# Patient Record
Sex: Male | Born: 1935
Health system: Southern US, Community
[De-identification: ages and names within clinical notes are randomized; demographics above are authoritative.]

## PROBLEM LIST (undated history)

## (undated) DIAGNOSIS — H3530 Unspecified macular degeneration: Secondary | ICD-10-CM

## (undated) DIAGNOSIS — D649 Anemia, unspecified: Secondary | ICD-10-CM

## (undated) DIAGNOSIS — F419 Anxiety disorder, unspecified: Secondary | ICD-10-CM

## (undated) DIAGNOSIS — K219 Gastro-esophageal reflux disease without esophagitis: Secondary | ICD-10-CM

## (undated) DIAGNOSIS — R413 Other amnesia: Secondary | ICD-10-CM

## (undated) DIAGNOSIS — K5900 Constipation, unspecified: Secondary | ICD-10-CM

## (undated) DIAGNOSIS — T8859XA Other complications of anesthesia, initial encounter: Secondary | ICD-10-CM

## (undated) DIAGNOSIS — M1990 Unspecified osteoarthritis, unspecified site: Secondary | ICD-10-CM

## (undated) DIAGNOSIS — C801 Malignant (primary) neoplasm, unspecified: Secondary | ICD-10-CM

## (undated) DIAGNOSIS — F32A Depression, unspecified: Secondary | ICD-10-CM

## (undated) DIAGNOSIS — F329 Major depressive disorder, single episode, unspecified: Secondary | ICD-10-CM

## (undated) DIAGNOSIS — T4145XA Adverse effect of unspecified anesthetic, initial encounter: Secondary | ICD-10-CM

## (undated) DIAGNOSIS — L409 Psoriasis, unspecified: Secondary | ICD-10-CM

## (undated) DIAGNOSIS — I10 Essential (primary) hypertension: Secondary | ICD-10-CM

## (undated) DIAGNOSIS — H9190 Unspecified hearing loss, unspecified ear: Secondary | ICD-10-CM

## (undated) HISTORY — PX: TONSILLECTOMY: SUR1361

## (undated) HISTORY — PX: CATARACT EXTRACTION W/ INTRAOCULAR LENS  IMPLANT, BILATERAL: SHX1307

## (undated) HISTORY — PX: COLONOSCOPY W/ BIOPSIES AND POLYPECTOMY: SHX1376

## (undated) HISTORY — PX: APPENDECTOMY: SHX54

---
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
## 2012-10-21 ENCOUNTER — Other Ambulatory Visit (HOSPITAL_COMMUNITY)
Admission: RE | Admit: 2012-10-21 | Discharge: 2012-10-21 | Disposition: A | Discharge status: Home / Self Care | Payer: Medicare Other | Source: Ambulatory Visit | Attending: Otolaryngology | Admitting: Otolaryngology

## 2012-10-21 DIAGNOSIS — R599 Enlarged lymph nodes, unspecified: Secondary | ICD-10-CM | POA: Insufficient documentation

## 2012-11-14 ENCOUNTER — Other Ambulatory Visit (HOSPITAL_COMMUNITY): Payer: Self-pay | Admitting: Otolaryngology

## 2012-11-14 ENCOUNTER — Encounter (HOSPITAL_COMMUNITY): Payer: Self-pay | Admitting: *Deleted

## 2012-11-14 NOTE — Progress Notes (Signed)
Pt denies SOB, chest pain, and being under the care of a cardiologist. Pt denies having an EKG and chest x ray within the last year. Pt also denies having a stress test, echo, and cardiac cath. Pt states that he has difficulty breathing and swallowing when laying flat on his back.

## 2012-11-18 ENCOUNTER — Inpatient Hospital Stay (HOSPITAL_COMMUNITY): Payer: Medicare Other | Admitting: Certified Registered"

## 2012-11-18 ENCOUNTER — Inpatient Hospital Stay (HOSPITAL_COMMUNITY): Payer: Medicare Other

## 2012-11-18 ENCOUNTER — Encounter (HOSPITAL_COMMUNITY)
Admission: RE | Disposition: A | Discharge status: Home / Self Care | Payer: Self-pay | Source: Ambulatory Visit | Attending: Otolaryngology

## 2012-11-18 ENCOUNTER — Inpatient Hospital Stay (HOSPITAL_COMMUNITY)
Admission: RE | Admit: 2012-11-18 | Discharge: 2012-11-20 | DRG: 828 | Disposition: A | Discharge status: Home / Self Care | Payer: Medicare Other | Source: Ambulatory Visit | Attending: Otolaryngology | Admitting: Otolaryngology

## 2012-11-18 ENCOUNTER — Encounter (HOSPITAL_COMMUNITY): Payer: Self-pay | Admitting: Certified Registered"

## 2012-11-18 ENCOUNTER — Encounter (HOSPITAL_COMMUNITY): Payer: Medicare Other | Admitting: Certified Registered"

## 2012-11-18 DIAGNOSIS — M129 Arthropathy, unspecified: Secondary | ICD-10-CM | POA: Diagnosis present

## 2012-11-18 DIAGNOSIS — C4442 Squamous cell carcinoma of skin of scalp and neck: Secondary | ICD-10-CM

## 2012-11-18 DIAGNOSIS — Z85828 Personal history of other malignant neoplasm of skin: Secondary | ICD-10-CM

## 2012-11-18 DIAGNOSIS — H3530 Unspecified macular degeneration: Secondary | ICD-10-CM | POA: Diagnosis present

## 2012-11-18 DIAGNOSIS — K219 Gastro-esophageal reflux disease without esophagitis: Secondary | ICD-10-CM | POA: Diagnosis present

## 2012-11-18 DIAGNOSIS — I10 Essential (primary) hypertension: Secondary | ICD-10-CM | POA: Diagnosis present

## 2012-11-18 DIAGNOSIS — C50919 Malignant neoplasm of unspecified site of unspecified female breast: Principal | ICD-10-CM | POA: Diagnosis present

## 2012-11-18 DIAGNOSIS — Z87891 Personal history of nicotine dependence: Secondary | ICD-10-CM

## 2012-11-18 DIAGNOSIS — H9190 Unspecified hearing loss, unspecified ear: Secondary | ICD-10-CM | POA: Diagnosis present

## 2012-11-18 HISTORY — PX: RADICAL NECK DISSECTION: SHX2284

## 2012-11-18 HISTORY — DX: Psoriasis, unspecified: L40.9

## 2012-11-18 HISTORY — PX: PAROTIDECTOMY: SHX2163

## 2012-11-18 HISTORY — DX: Unspecified hearing loss, unspecified ear: H91.90

## 2012-11-18 HISTORY — DX: Gastro-esophageal reflux disease without esophagitis: K21.9

## 2012-11-18 HISTORY — DX: Unspecified osteoarthritis, unspecified site: M19.90

## 2012-11-18 HISTORY — DX: Essential (primary) hypertension: I10

## 2012-11-18 HISTORY — DX: Unspecified macular degeneration: H35.30

## 2012-11-18 HISTORY — DX: Other complications of anesthesia, initial encounter: T88.59XA

## 2012-11-18 HISTORY — DX: Adverse effect of unspecified anesthetic, initial encounter: T41.45XA

## 2012-11-18 LAB — COMPREHENSIVE METABOLIC PANEL
ALT: 16 U/L (ref 0–53)
Alkaline Phosphatase: 71 U/L (ref 39–117)
CO2: 24 mEq/L (ref 19–32)
Creatinine, Ser: 0.65 mg/dL (ref 0.50–1.35)
GFR calc Af Amer: >90 mL/min (ref >90)
GFR calc non Af Amer: 85 mL/min — ABNORMAL LOW (ref >90)
Glucose, Bld: 105 mg/dL — ABNORMAL HIGH (ref 70–99)
Potassium: 4.1 mEq/L (ref 3.5–5.1)
Sodium: 130 mEq/L — ABNORMAL LOW (ref 135–145)
Total Bilirubin: 0.6 mg/dL (ref 0.3–1.2)
Total Protein: 6.8 g/dL (ref 6.0–8.3)

## 2012-11-18 LAB — CBC
HCT: 39.9 % (ref 39.0–52.0)
MCHC: 35.3 g/dL (ref 30.0–36.0)
Platelets: 193 10*3/uL (ref 150–400)
RDW: 12.6 % (ref 11.5–15.5)

## 2012-11-18 LAB — GLUCOSE, CAPILLARY: Glucose-Capillary: 127 mg/dL — ABNORMAL HIGH (ref 70–99)

## 2012-11-18 IMAGING — CR DG CHEST 2V
2 series · 2 of 2 positions shown · non-contrast
Comparison: None.

CLINICAL DATA: Hypertension. Preop.

EXAM:
CHEST  2 VIEW

[w chest pa]
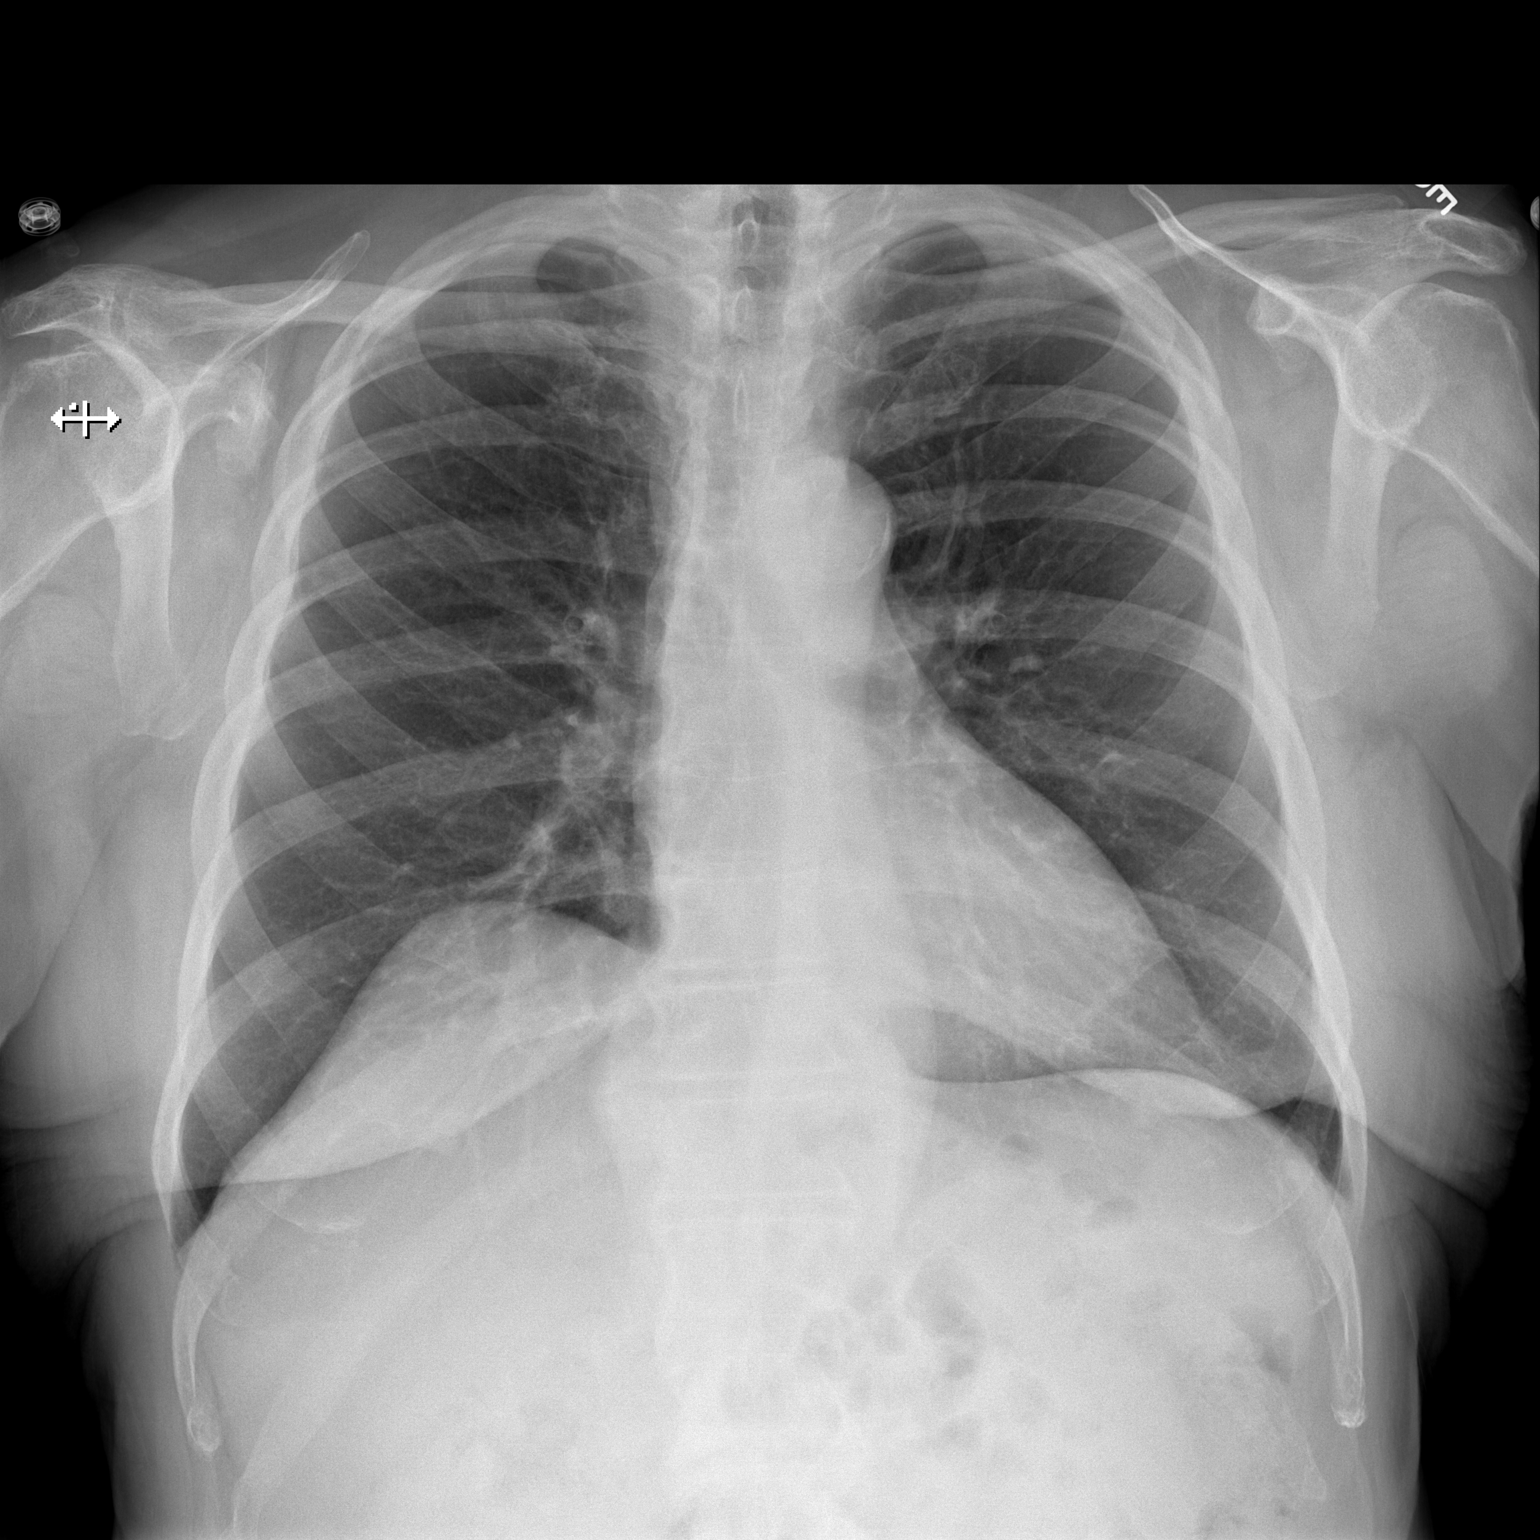

[w chest lat]
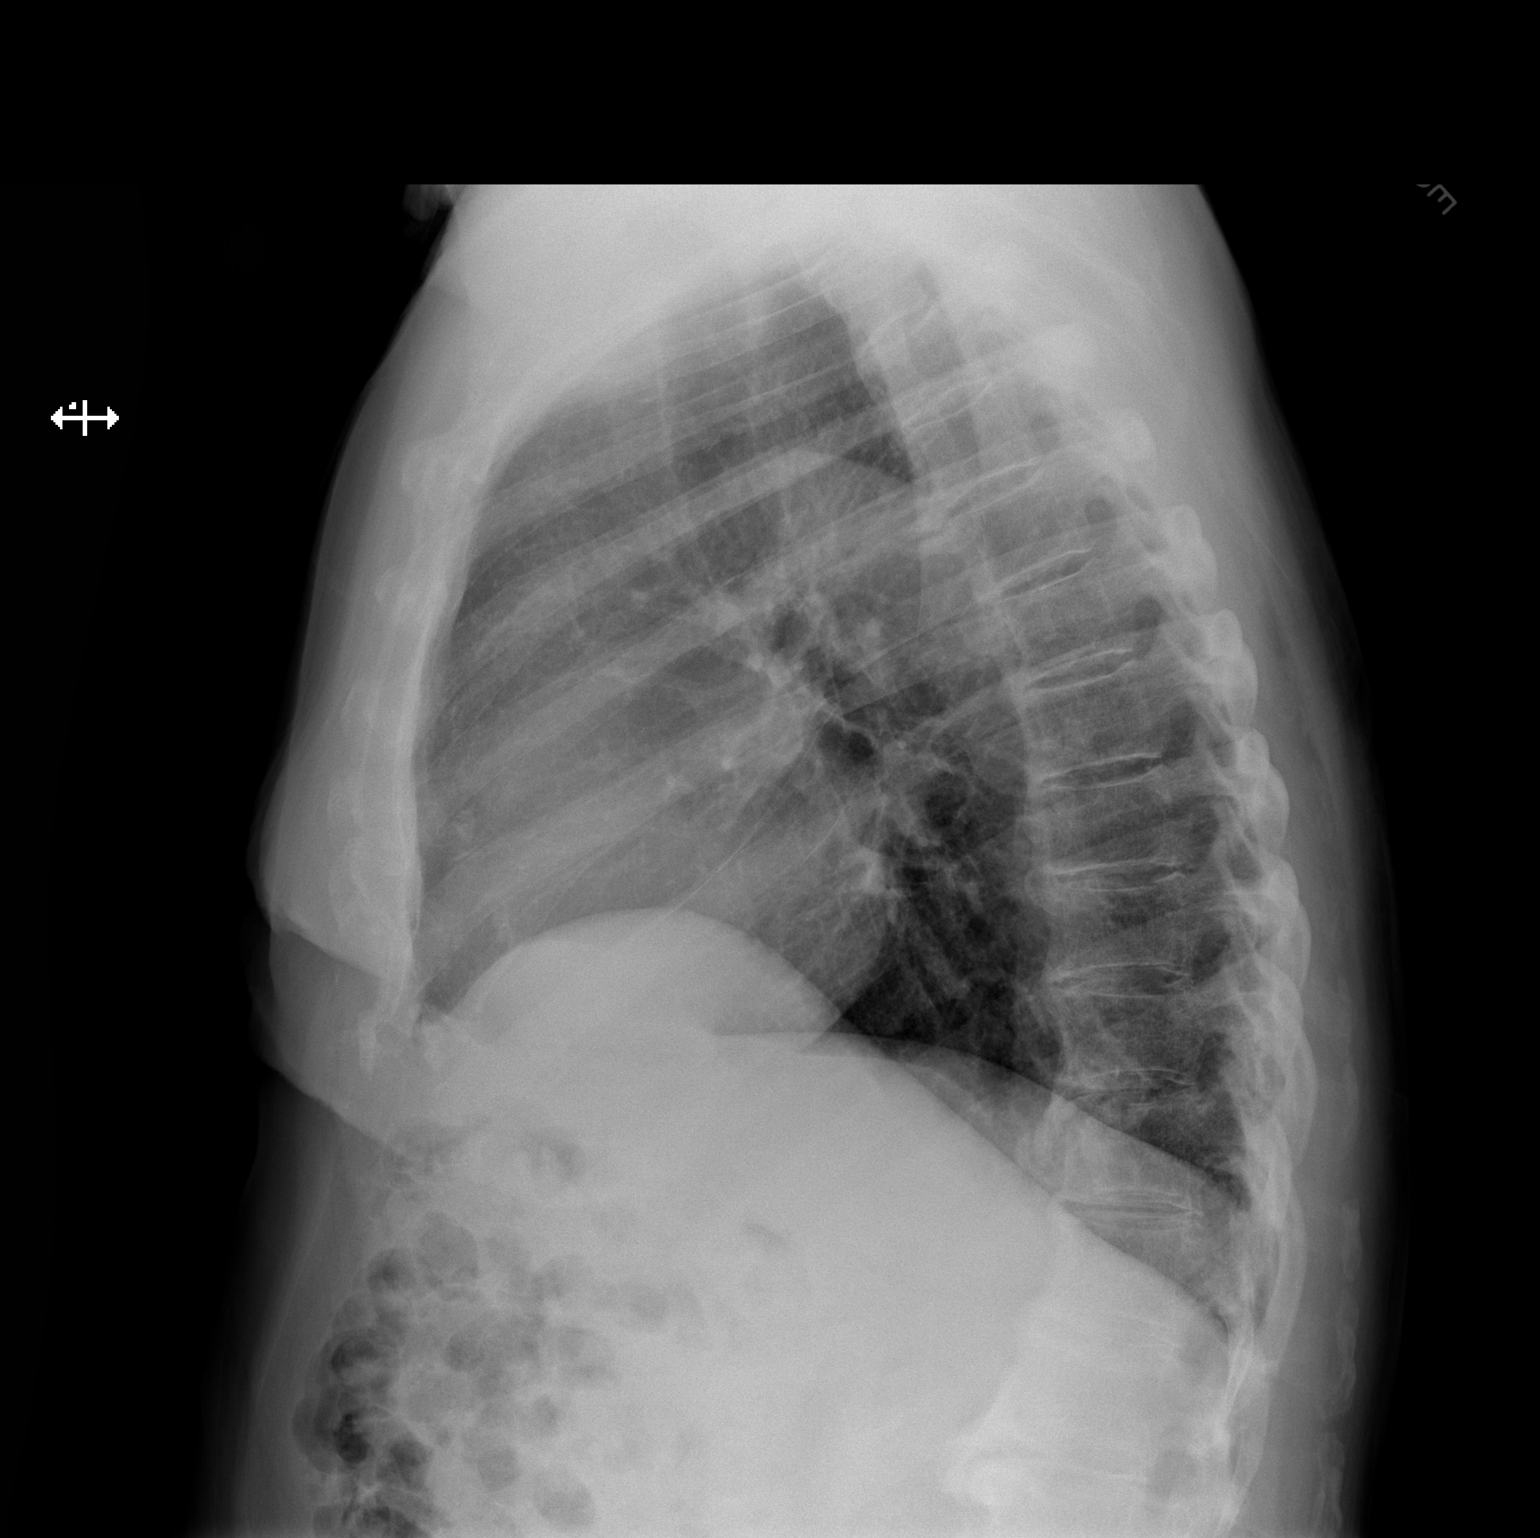

[2 of 2 positions shown; findings below may reference images not displayed]

FINDINGS: No cardiomegaly. No evidence of aortic enlargement when accounting
for tortuosity and thoracic spine endplate spurring. No adenopathy.
Aortic atherosclerosis.

Eventration of the right diaphragm. No infiltrate, edema, effusion,
or pneumothorax.
IMPRESSION: No evidence of active cardiopulmonary disease.

## 2012-11-18 SURGERY — EXCISION, PAROTID GLAND
Anesthesia: General | Site: Neck | Laterality: Right | Wound class: Clean

## 2012-11-18 MED ORDER — KCL IN DEXTROSE-NACL 20-5-0.45 MEQ/L-%-% IV SOLN
INTRAVENOUS | Status: DC
  Administered 2012-11-18 – 2012-11-19 (×3): via INTRAVENOUS
  Filled 2012-11-18 (×5): qty 1000

## 2012-11-18 MED ORDER — LIDOCAINE HCL (CARDIAC) 20 MG/ML IV SOLN
INTRAVENOUS | Status: DC | PRN
  Administered 2012-11-18: 60 mg via INTRAVENOUS

## 2012-11-18 MED ORDER — LIDOCAINE-EPINEPHRINE 1 %-1:100000 IJ SOLN
INTRAMUSCULAR | Status: DC | PRN
  Administered 2012-11-18: 20 mL

## 2012-11-18 MED ORDER — CEFAZOLIN SODIUM-DEXTROSE 2-3 GM-% IV SOLR
INTRAVENOUS | Status: AC
  Filled 2012-11-18: qty 50

## 2012-11-18 MED ORDER — CEFAZOLIN SODIUM-DEXTROSE 2-3 GM-% IV SOLR
2.0000 g | Freq: Once | INTRAVENOUS | Status: AC
  Administered 2012-11-18: 2 g via INTRAVENOUS

## 2012-11-18 MED ORDER — HYDROCODONE-ACETAMINOPHEN 5-325 MG PO TABS
1.0000 | ORAL_TABLET | ORAL | Status: DC | PRN
  Administered 2012-11-18 – 2012-11-19 (×5): 1 via ORAL
  Filled 2012-11-18 (×4): qty 1

## 2012-11-18 MED ORDER — ONDANSETRON HCL 4 MG/2ML IJ SOLN: 4.0000 mg | Freq: Once | INTRAMUSCULAR | Status: DC | PRN

## 2012-11-18 MED ORDER — FENTANYL CITRATE 0.05 MG/ML IJ SOLN
25.0000 ug | INTRAMUSCULAR | Status: DC | PRN
  Administered 2012-11-18 (×2): 25 ug via INTRAVENOUS
  Administered 2012-11-18: 50 ug via INTRAVENOUS

## 2012-11-18 MED ORDER — 0.9 % SODIUM CHLORIDE (POUR BTL) OPTIME
TOPICAL | Status: DC | PRN
  Administered 2012-11-18: 1000 mL

## 2012-11-18 MED ORDER — ONDANSETRON HCL 4 MG/2ML IJ SOLN
INTRAMUSCULAR | Status: DC | PRN
  Administered 2012-11-18: 4 mg via INTRAMUSCULAR

## 2012-11-18 MED ORDER — LACTATED RINGERS IV SOLN
INTRAVENOUS | Status: DC | PRN
  Administered 2012-11-18 (×2): via INTRAVENOUS

## 2012-11-18 MED ORDER — OXYCODONE-ACETAMINOPHEN 5-325 MG PO TABS
ORAL_TABLET | ORAL | Status: AC
  Filled 2012-11-18: qty 1

## 2012-11-18 MED ORDER — FENTANYL CITRATE 0.05 MG/ML IJ SOLN
INTRAMUSCULAR | Status: DC | PRN
  Administered 2012-11-18 (×2): 50 ug via INTRAVENOUS
  Administered 2012-11-18: 100 ug via INTRAVENOUS
  Administered 2012-11-18: 50 ug via INTRAVENOUS

## 2012-11-18 MED ORDER — PROPOFOL 10 MG/ML IV BOLUS
INTRAVENOUS | Status: DC | PRN
  Administered 2012-11-18: 120 mg via INTRAVENOUS

## 2012-11-18 MED ORDER — NEOSTIGMINE METHYLSULFATE 1 MG/ML IJ SOLN
INTRAMUSCULAR | Status: DC | PRN
  Administered 2012-11-18: 2 mg via INTRAVENOUS

## 2012-11-18 MED ORDER — ROCURONIUM BROMIDE 100 MG/10ML IV SOLN
INTRAVENOUS | Status: DC | PRN
  Administered 2012-11-18: 40 mg via INTRAVENOUS

## 2012-11-18 MED ORDER — MORPHINE SULFATE 2 MG/ML IJ SOLN
2.0000 mg | INTRAMUSCULAR | Status: DC | PRN
  Administered 2012-11-19: 2 mg via INTRAVENOUS
  Filled 2012-11-18: qty 1

## 2012-11-18 MED ORDER — LISINOPRIL 20 MG PO TABS
20.0000 mg | ORAL_TABLET | Freq: Every day | ORAL | Status: DC
  Administered 2012-11-18 – 2012-11-20 (×3): 20 mg via ORAL
  Filled 2012-11-18 (×4): qty 1

## 2012-11-18 MED ORDER — HYDROMORPHONE HCL PF 1 MG/ML IJ SOLN
INTRAMUSCULAR | Status: AC
  Filled 2012-11-18: qty 1

## 2012-11-18 MED ORDER — FENTANYL CITRATE 0.05 MG/ML IJ SOLN
INTRAMUSCULAR | Status: AC
  Administered 2012-11-18: 25 ug via INTRAVENOUS
  Filled 2012-11-18: qty 2

## 2012-11-18 MED ORDER — GLYCOPYRROLATE 0.2 MG/ML IJ SOLN
INTRAMUSCULAR | Status: DC | PRN
  Administered 2012-11-18: 0.2 mg via INTRAVENOUS

## 2012-11-18 MED ORDER — BACITRACIN ZINC 500 UNIT/GM EX OINT
1.0000 "application " | TOPICAL_OINTMENT | Freq: Three times a day (TID) | CUTANEOUS | Status: DC
  Administered 2012-11-18 – 2012-11-20 (×5): 1 via TOPICAL
  Filled 2012-11-18: qty 28.35

## 2012-11-18 MED ORDER — ONDANSETRON HCL 4 MG/2ML IJ SOLN: INTRAMUSCULAR | Status: DC | PRN

## 2012-11-18 MED ORDER — BACITRACIN ZINC 500 UNIT/GM EX OINT
TOPICAL_OINTMENT | CUTANEOUS | Status: AC
  Filled 2012-11-18: qty 15

## 2012-11-18 MED ORDER — LIDOCAINE-EPINEPHRINE 1 %-1:100000 IJ SOLN
INTRAMUSCULAR | Status: AC
  Filled 2012-11-18: qty 1

## 2012-11-18 MED ORDER — BACITRACIN ZINC 500 UNIT/GM EX OINT
TOPICAL_OINTMENT | CUTANEOUS | Status: DC | PRN
  Administered 2012-11-18: 1 via TOPICAL

## 2012-11-18 MED ORDER — HYDROCODONE-ACETAMINOPHEN 5-325 MG PO TABS
ORAL_TABLET | ORAL | Status: AC
  Administered 2012-11-18: 1 via ORAL
  Filled 2012-11-18: qty 1

## 2012-11-18 MED ORDER — CEFAZOLIN SODIUM 1-5 GM-% IV SOLN
1.0000 g | Freq: Three times a day (TID) | INTRAVENOUS | Status: AC
  Administered 2012-11-18 – 2012-11-19 (×3): 1 g via INTRAVENOUS
  Filled 2012-11-18 (×3): qty 50

## 2012-11-18 SURGICAL SUPPLY — 73 items
APL SKNCLS STERI-STRIP NONHPOA (GAUZE/BANDAGES/DRESSINGS) ×1
APPLIER CLIP 9.375 SM OPEN (CLIP)
APR CLP SM 9.3 20 MLT OPN (CLIP)
ATTRACTOMAT 16X20 MAGNETIC DRP (DRAPES) ×1 IMPLANT
BENZOIN TINCTURE PRP APPL 2/3 (GAUZE/BANDAGES/DRESSINGS) ×1 IMPLANT
BLADE SURG 15 STRL LF DISP TIS (BLADE) ×3 IMPLANT
BLADE SURG 15 STRL SS (BLADE) ×4
BLADE SURG ROTATE 9660 (MISCELLANEOUS) IMPLANT
CANISTER SUCTION 2500CC (MISCELLANEOUS) ×2 IMPLANT
CATH ROBINSON RED A/P 14FR (CATHETERS) ×1 IMPLANT
CLEANER TIP ELECTROSURG 2X2 (MISCELLANEOUS) ×2 IMPLANT
CLIP APPLIE 9.375 SM OPEN (CLIP) IMPLANT
CLOTH BEACON ORANGE TIMEOUT ST (SAFETY) ×1 IMPLANT
CONT SPEC 4OZ CLIKSEAL STRL BL (MISCELLANEOUS) ×1 IMPLANT
CORDS BIPOLAR (ELECTRODE) ×2 IMPLANT
COVER SURGICAL LIGHT HANDLE (MISCELLANEOUS) ×2 IMPLANT
CRADLE DONUT ADULT HEAD (MISCELLANEOUS) IMPLANT
DRAIN JACKSON RD 7FR 3/32 (WOUND CARE) IMPLANT
DRAIN SNY 10 ROU (WOUND CARE) ×1 IMPLANT
DRAIN WOUND SNY 15 RND (WOUND CARE) IMPLANT
DRAPE INCISE 13X13 STRL (DRAPES) ×2 IMPLANT
DRAPE PROXIMA HALF (DRAPES) ×1 IMPLANT
ELECT COATED BLADE 2.86 ST (ELECTRODE) ×2 IMPLANT
ELECT PAIRED SUBDERMAL (MISCELLANEOUS) ×2
ELECT REM PT RETURN 9FT ADLT (ELECTROSURGICAL) ×2
ELECTRODE PAIRED SUBDERMAL (MISCELLANEOUS) IMPLANT
ELECTRODE REM PT RTRN 9FT ADLT (ELECTROSURGICAL) ×1 IMPLANT
EVACUATOR SILICONE 100CC (DRAIN) ×2 IMPLANT
GAUZE PACKING FOLDED 1/2 STR (GAUZE/BANDAGES/DRESSINGS) ×1 IMPLANT
GAUZE SPONGE 4X4 16PLY XRAY LF (GAUZE/BANDAGES/DRESSINGS) ×3 IMPLANT
GEL ULTRASOUND 20GR AQUASONIC (MISCELLANEOUS) ×1 IMPLANT
GLOVE BIO SURGEON STRL SZ7.5 (GLOVE) ×3 IMPLANT
GLOVE BIOGEL PI IND STRL 7.0 (GLOVE) IMPLANT
GLOVE BIOGEL PI INDICATOR 7.0 (GLOVE) ×2
GLOVE ECLIPSE 8.0 STRL XLNG CF (GLOVE) ×1 IMPLANT
GLOVE SS BIOGEL STRL SZ 8 (GLOVE) IMPLANT
GLOVE SUPERSENSE BIOGEL SZ 8 (GLOVE) ×1
GLOVE SURG SS PI 7.0 STRL IVOR (GLOVE) ×2 IMPLANT
GOWN STRL NON-REIN LRG LVL3 (GOWN DISPOSABLE) ×5 IMPLANT
GOWN STRL REIN XL XLG (GOWN DISPOSABLE) ×1 IMPLANT
KIT BASIN OR (CUSTOM PROCEDURE TRAY) ×2 IMPLANT
KIT ROOM TURNOVER OR (KITS) ×2 IMPLANT
LOCATOR NERVE 3 VOLT (DISPOSABLE) IMPLANT
NS IRRIG 1000ML POUR BTL (IV SOLUTION) ×3 IMPLANT
PAD ARMBOARD 7.5X6 YLW CONV (MISCELLANEOUS) ×4 IMPLANT
PENCIL BUTTON HOLSTER BLD 10FT (ELECTRODE) ×2 IMPLANT
PROBE NERVBE PRASS .33 (MISCELLANEOUS) ×1 IMPLANT
SOLUTION BETADINE 4OZ (MISCELLANEOUS) ×1 IMPLANT
SPECIMEN JAR MEDIUM (MISCELLANEOUS) ×1 IMPLANT
SPECIMEN JAR SMALL (MISCELLANEOUS) ×2 IMPLANT
SPONGE GAUZE 4X4 12PLY (GAUZE/BANDAGES/DRESSINGS) ×1 IMPLANT
SPONGE INTESTINAL PEANUT (DISPOSABLE) IMPLANT
SPONGE LAP 18X18 X RAY DECT (DISPOSABLE) ×2 IMPLANT
STAPLER VISISTAT 35W (STAPLE) ×2 IMPLANT
SUT ETHILON 2 0 FS 18 (SUTURE) ×2 IMPLANT
SUT ETHILON 3 0 PS 1 (SUTURE) ×1 IMPLANT
SUT ETHILON 5 0 P 3 18 (SUTURE) ×1
SUT NYLON ETHILON 5-0 P-3 1X18 (SUTURE) ×1 IMPLANT
SUT SILK 2 0 FS (SUTURE) ×2 IMPLANT
SUT SILK 2 0 SH CR/8 (SUTURE) ×2 IMPLANT
SUT SILK 3 0 REEL (SUTURE) ×5 IMPLANT
SUT SILK 4 0 REEL (SUTURE) ×2 IMPLANT
SUT VIC AB 3-0 SH 27 (SUTURE) ×4
SUT VIC AB 3-0 SH 27X BRD (SUTURE) ×4 IMPLANT
SUT VIC AB 4-0 PS2 27 (SUTURE) ×1 IMPLANT
SUT VICRYL 4-0 PS2 18IN ABS (SUTURE) ×2 IMPLANT
TOWEL OR 17X24 6PK STRL BLUE (TOWEL DISPOSABLE) ×3 IMPLANT
TOWEL OR 17X26 10 PK STRL BLUE (TOWEL DISPOSABLE) ×2 IMPLANT
TRAY ENT MC OR (CUSTOM PROCEDURE TRAY) ×2 IMPLANT
TRAY FOLEY CATH 14FRSI W/METER (CATHETERS) IMPLANT
TUBE FEEDING 10FR FLEXIFLO (MISCELLANEOUS) IMPLANT
UNDERPAD 30X30 INCONTINENT (UNDERPADS AND DIAPERS) ×1 IMPLANT
WATER STERILE IRR 1000ML POUR (IV SOLUTION) ×2 IMPLANT

## 2012-11-18 NOTE — Transfer of Care (Signed)
Immediate Anesthesia Transfer of Care Note  Patient: Ross Cameron  Procedure(s) Performed: Procedure(s) with comments: RIGHT PAROTIDECTOMY (Right) RIGHT RADICAL NECK DISSECTION/Right Selective Neck Dissection   (Right) - Right Selective Neck Dissection   Patient Location: PACU  Anesthesia Type:General  Level of Consciousness: awake, alert , oriented and patient cooperative  Airway & Oxygen Therapy: Patient Spontanous Breathing and Patient connected to nasal cannula oxygen  Post-op Assessment: Report given to PACU RN, Post -op Vital signs reviewed and stable and Patient moving all extremities  Post vital signs: Reviewed and stable  Complications: No apparent anesthesia complications

## 2012-11-18 NOTE — Anesthesia Procedure Notes (Signed)
Procedure Name: Intubation Date/Time: 11/18/2012 8:53 AM Performed by: Jerilee Hoh Pre-anesthesia Checklist: Patient identified, Emergency Drugs available, Suction available and Patient being monitored Patient Re-evaluated:Patient Re-evaluated prior to inductionOxygen Delivery Method: Circle system utilized Preoxygenation: Pre-oxygenation with 100% oxygen Intubation Type: IV induction Ventilation: Mask ventilation without difficulty and Oral airway inserted - appropriate to patient size Laryngoscope Size: Mac and 4 Grade View: Grade I Tube type: Oral Tube size: 7.5 mm Number of attempts: 1 Airway Equipment and Method: Stylet Placement Confirmation: ETT inserted through vocal cords under direct vision,  positive ETCO2 and breath sounds checked- equal and bilateral Secured at: 22 cm Tube secured with: Tape Dental Injury: Teeth and Oropharynx as per pre-operative assessment

## 2012-11-18 NOTE — Brief Op Note (Signed)
11/18/2012  12:13 PM  PATIENT:  Ross Cameron  79 y.o. male  PRE-OPERATIVE DIAGNOSIS:  right neck cancer  POST-OPERATIVE DIAGNOSIS:  right neck cancer  PROCEDURE:  RIGHT SUPERFICIAL PAROTIDECTOMY, RIGHT SELECTIVE NECK DISSECTION ZONES 2 AND 3  SURGEON:  Surgeon(s) and Role:    * Christia Reading, MD - Primary    * Flo Shanks, MD - Assisting  PHYSICIAN ASSISTANT:   ASSISTANTS: Wolicki   ANESTHESIA:   general  EBL:  Total I/O In: 1500 [I.V.:1500] Out: 75 [Blood:75]  BLOOD ADMINISTERED:none  DRAINS: (10 Fr) Jackson-Pratt drain(s) with closed bulb suction in the right neck\   LOCAL MEDICATIONS USED:  LIDOCAINE   SPECIMEN:  Source of Specimen:  Right superior superficial parotid gland, right neck contents including right superficial parotid gland and zones 2 and 3  DISPOSITION OF SPECIMEN:  PATHOLOGY  COUNTS:  YES  TOURNIQUET:  * No tourniquets in log *  DICTATION: .Other Dictation: Dictation Number [id number]  PLAN OF CARE: Admit to inpatient   PATIENT DISPOSITION:  PACU - hemodynamically stable.   Delay start of Pharmacological VTE agent (>24hrs) due to surgical blood loss or risk of bleeding: yes

## 2012-11-18 NOTE — H&P (Signed)
Ross Cameron is an 79 y.o. male.   Chief Complaint: right neck mass HPI: 79 year old who had SCC removed from right neck skin three years ago.  He has now had a mass in the neck in the same area for the past couple of months.  FNA showed SCC.  He presents for surgical management.  Past Medical History  Diagnosis Date  . Psoriasis     Hx: of  . Hypertension   . Complication of anesthesia     Pt has a problem with breathing and swallowing when laying flat on his back  . Macular degeneration     Hx: of  . HOH (hard of hearing)     Hx; of does not wear hearing aids  . GERD (gastroesophageal reflux disease)   . Arthritis     Past Surgical History  Procedure Laterality Date  . Appendectomy    . Tonsillectomy    . Cataract extraction w/ intraocular lens  implant, bilateral      Hx: of  . Colonoscopy w/ biopsies and polypectomy      Hx: of    Family History  Problem Relation Age of Onset  . Cancer Other    Social History:  reports that he has quit smoking. His smoking use included Pipe. He has never used smokeless tobacco. He reports that he drinks alcohol. He reports that he does not use illicit drugs.  Allergies:  Allergies  Allergen Reactions  . Aspirin     Causes bleeding    Medications Prior to Admission  Medication Sig Dispense Refill  . beta carotene w/minerals (OCUVITE) tablet Take 1 tablet by mouth daily.      Marland Kitchen lisinopril (PRINIVIL,ZESTRIL) 20 MG tablet Take 20 mg by mouth daily.        Results for orders placed during the hospital encounter of 11/18/12 (from the past 48 hour(s))  COMPREHENSIVE METABOLIC PANEL     Status: Abnormal   Collection Time    11/18/12  7:03 AM      Result Value Range   Sodium 130 (*) 135 - 145 mEq/L   Potassium 4.1  3.5 - 5.1 mEq/L   Chloride 95 (*) 96 - 112 mEq/L   CO2 24  19 - 32 mEq/L   Glucose, Bld 105 (*) 70 - 99 mg/dL   BUN 9  6 - 23 mg/dL   Creatinine, Ser 1.61  0.50 - 1.35 mg/dL   Calcium 9.0  8.4 - 09.6 mg/dL   Total Protein 6.8  6.0 - 8.3 g/dL   Albumin 3.8  3.5 - 5.2 g/dL   AST 18  0 - 37 U/L   ALT 16  0 - 53 U/L   Alkaline Phosphatase 71  39 - 117 U/L   Total Bilirubin 0.6  0.3 - 1.2 mg/dL   GFR calc non Af Amer 85 (*) >90 mL/min   GFR calc Af Amer >90  >90 mL/min   Comment: (NOTE)     The eGFR has been calculated using the CKD EPI equation.     This calculation has not been validated in all clinical situations.     eGFR's persistently <90 mL/min signify possible Chronic Kidney     Disease.  CBC     Status: None   Collection Time    11/18/12  7:03 AM      Result Value Range   WBC 5.7  4.0 - 10.5 K/uL   RBC 4.44  4.22 - 5.81 MIL/uL  Hemoglobin 14.1  13.0 - 17.0 g/dL   HCT 16.1  09.6 - 04.5 %   MCV 89.9  78.0 - 100.0 fL   MCH 31.8  26.0 - 34.0 pg   MCHC 35.3  30.0 - 36.0 g/dL   RDW 40.9  81.1 - 91.4 %   Platelets 193  150 - 400 K/uL   Dg Chest 2 View  11/18/2012   CLINICAL DATA:  Hypertension. Preop.  EXAM: CHEST  2 VIEW  COMPARISON:  None.  FINDINGS: No cardiomegaly. No evidence of aortic enlargement when accounting for tortuosity and thoracic spine endplate spurring. No adenopathy. Aortic atherosclerosis.  Eventration of the right diaphragm. No infiltrate, edema, effusion, or pneumothorax.  IMPRESSION: No evidence of active cardiopulmonary disease.   Electronically Signed   By: Tiburcio Pea M.D.   On: 11/18/2012 06:56    Review of Systems  Musculoskeletal: Positive for joint pain.  All other systems reviewed and are negative.    Blood pressure 177/74, pulse 78, temperature 97.2 F (36.2 C), temperature source Oral, resp. rate 18, height 5' 5.25" (1.657 m), weight 80.74 kg (178 lb), SpO2 100.00%. Physical Exam  Constitutional: He is oriented to person, place, and time. He appears well-developed and well-nourished. No distress.  HENT:  Head: Normocephalic and atraumatic.  Right Ear: External ear normal.  Left Ear: External ear normal.  Nose: Nose normal.  Mouth/Throat:  Oropharynx is clear and moist.  Eyes: Conjunctivae and EOM are normal. Pupils are equal, round, and reactive to light.  Neck: Normal range of motion. Neck supple.  Right zone 2 mass  Cardiovascular: Normal rate.   Respiratory: Effort normal.  GI:  Did not examine.  Genitourinary:  Did not examine.  Musculoskeletal: Normal range of motion.  Neurological: He is alert and oriented to person, place, and time. No cranial nerve deficit.  Skin: Skin is warm and dry.  Psychiatric: He has a normal mood and affect. His behavior is normal. Judgment and thought content normal.     Assessment/Plan Right neck metastatic SCC To OR for right parotidectomy and selective neck dissection.  Anticipate hospital stay of 2-4 days.  Ross Cameron 11/18/2012, 8:28 AM

## 2012-11-18 NOTE — Anesthesia Postprocedure Evaluation (Signed)
  Anesthesia Post-op Note  Patient: Ross Cameron  Procedure(s) Performed: Procedure(s) with comments: RIGHT PAROTIDECTOMY (Right) RIGHT RADICAL NECK DISSECTION/Right Selective Neck Dissection   (Right) - Right Selective Neck Dissection   Patient Location: PACU  Anesthesia Type:General  Level of Consciousness: awake, alert  and oriented  Airway and Oxygen Therapy: Patient Spontanous Breathing and Patient connected to nasal cannula oxygen  Post-op Pain: mild  Post-op Assessment: Post-op Vital signs reviewed, Patient's Cardiovascular Status Stable, Respiratory Function Stable, Patent Airway and Pain level controlled  Post-op Vital Signs: stable  Complications: No apparent anesthesia complications

## 2012-11-18 NOTE — Anesthesia Preprocedure Evaluation (Signed)
Anesthesia Evaluation  Patient identified by MRN, date of birth, ID band Patient awake    Reviewed: Allergy & Precautions, H&P , NPO status , Patient's Chart, lab work & pertinent test results  Airway Mallampati: II      Dental  (+) Partial Upper and Dental Advisory Given   Pulmonary  breath sounds clear to auscultation        Cardiovascular Rhythm:Regular Rate:Normal     Neuro/Psych    GI/Hepatic   Endo/Other    Renal/GU      Musculoskeletal   Abdominal   Peds  Hematology   Anesthesia Other Findings   Reproductive/Obstetrics                           Anesthesia Physical Anesthesia Plan  ASA: III  Anesthesia Plan: General   Post-op Pain Management:    Induction: Intravenous  Airway Management Planned: Oral ETT  Additional Equipment:   Intra-op Plan:   Post-operative Plan:   Informed Consent: I have reviewed the patients History and Physical, chart, labs and discussed the procedure including the risks, benefits and alternatives for the proposed anesthesia with the patient or authorized representative who has indicated his/her understanding and acceptance.   Dental advisory given  Plan Discussed with: CRNA and Anesthesiologist  Anesthesia Plan Comments:         Anesthesia Quick Evaluation

## 2012-11-18 NOTE — Progress Notes (Signed)
Doing well, minimal pain.   Incision clean, dry, intact, no swelling or hematoma. FN mild lip weakness, otherwise intact.JP intact.  Stable post op.  Continue care.

## 2012-11-18 NOTE — Progress Notes (Signed)
Pt states he swallows better using a straw

## 2012-11-18 NOTE — Preoperative (Signed)
Beta Blockers   Reason not to administer Beta Blockers:Not Applicable 

## 2012-11-19 MED ORDER — ACETAMINOPHEN 80 MG PO CHEW
320.0000 mg | CHEWABLE_TABLET | ORAL | Status: DC | PRN
  Filled 2012-11-19: qty 4

## 2012-11-19 MED ORDER — PHENOL 1.4 % MT LIQD: 1.0000 | Freq: Three times a day (TID) | OROMUCOSAL | Status: DC | PRN

## 2012-11-19 MED ORDER — ONDANSETRON HCL 4 MG/2ML IJ SOLN: 4.0000 mg | Freq: Four times a day (QID) | INTRAMUSCULAR | Status: DC | PRN

## 2012-11-19 NOTE — Op Note (Signed)
Ross Cameron, Ross Cameron NO.:  [id number]  MEDICAL RECORD NO.:  [id number]  LOCATION:  6N21C                        FACILITY:  [facility]  PHYSICIAN:  Antony Contras, MD     DATE OF BIRTH:  1923/11/08  DATE OF PROCEDURE:  11/18/2012 DATE OF DISCHARGE:                              OPERATIVE REPORT   PREOPERATIVE DIAGNOSIS:  Right cervical metastatic squamous cell carcinoma.  POSTOPERATIVE DIAGNOSIS:  Right cervical metastatic squamous cell carcinoma.  PROCEDURE:  Right superficial parotidectomy and right selective neck dissection zones, 2 and 3.  SURGEON:  Excell Seltzer. Jenne Pane, MD.  ASSISTANTZola Button T. Lazarus Salines, M.D.  ANESTHESIA:  General endotracheal anesthesia.  COMPLICATIONS:  None.  INDICATION:  The patient is an 79 year old white male, who had a skin cancer removed from the right neck 3 years ago  consistent with squamous cell carcinoma.  About 2 or 3 months ago, he has noticed a mass growing in the right neck, deep to the excision site.  Fine-needle aspiration demonstrated squamous cell carcinoma, so he presents to the operating room for surgical management.  FINDINGS:  The patient was found to have a mass in the right superficial parotid gland inferiorly, that measured approximately 1.5 cm.  There were no other palpable nodes or masses found.  The entire superficial parotid gland, and zones 2 and 3 of the neck were dissected and removed.  DESCRIPTION OF PROCEDURE:  The patient was identified in the holding room and informed consent having been obtained including discussion of risks, benefits, alternatives, the patient was brought to the operative suite and put on the operating table in supine position.  Anesthesia was induced and the patient was intubated by Anesthesia team without difficulty.  The patient was given antibiotics during the case.  The eyes were taped closed and a shoulder roll was placed.  The neck incision was marked with a marking pen  including in the right ear pain was then injected with 1% lidocaine with 1:100,000  epinephrine.  The neck was prepped and draped in sterile fashion.  After first placing the NIM monitor for parotidectomy.  The NIM monitor was turned on throughout the case.  After draping, the neck incision was made with a 15- blade scalpel and extended through the subcutaneous tissues and using Bovie electrocautery.  The platysma layer was divided inferiorly and  subplatysmal flap was then elevated along with a preparotid fascia flap, also perforated parotid gland.  The earlobe was then dissected free from the tissues and the posterior extend, the incision was also dissected in the subplatysmal plane.  The flaps were then tied back. The sternocleidomastoid muscle was then dissected from the anterior tissues to its insertion in the mastoid tip.  The tissues were then dissected down in front of the earlobe and also in front of the tragal cartilage.  Careful dissection was then performed along the deep to the tragal cartilage to the tympanic ring and styloid foramen.  Careful dissection in this region will be main trunk of the facial nerve, which was then dissected in an antegrade fashion dividing the parotid gland during this process.  There was a point where milky fluid drained from the parotid  gland in this region.  The branches were kept intact while overlying parotid tissue was dissected free, and this involved dividing the gland at its mid point dissecting the lower part of the gland free from the inferior branch and keeping it attached to the neck dissection structures.  Careful dissection was performed throughout this part of the procedure until the parotid gland was fully freed from the facial nerve.  At this point, the superior portion, that was left was also dissected free with careful dissection, tracing out the various branches of the nerve and dividing the gland with bipolar electrocautery  and scissors until the superior segment was removed and passed to nursing for pathology.  At this point, further dissection was performed around the sternocleidomastoid muscle until the spinal accessory nerve was identified and then traced in a retrograde fashion.  Dissection was performed in the zone 2B dividing the fatty tissues from the digastric muscle, trapezius muscle, and sternocleidomastoid muscle.  These were then passed under the freed up spinal accessory nerve.  The digastric muscle was then fully dissected bringing the parotid gland down into the neck.  Further dissection was then performed along the sternocleidomastoid muscle and coming across the superficial to this cervical roots.  The soft tissues were then dissected free toward the carotid sheath structures with the inferior limit be omohyoid muscle. Further dissection was performed up to the jugular vein, which was then dissected free from fatty tissue using a 15-blade scalpel dividing branches and ligating.  Superiorly, the twelfth nerve was identified and traced dissected free from the fatty tissues while dividing the ansa cervicalis.  Tissues were then dissected off of the carotid sheath and carotid artery, and again using the omohyoid muscle,  the anterior extent of dissection.  Further dissection was then performed until the fatty tissues were then fully removed dissecting to toward the larynx. The tissues were marked with marking sutures and passed to nursing for pathology.  The entire surgical site was then copiously irrigated with saline and bleeding controlled with bipolar electrocautery.  Valsalva was given and no additional bleeding was seen.  A 10-French drain was then placed into the depths of wound and secured just under the earlobe using a 2-0 nylon suture in a standard drain stitch.  The flaps were then freed up and laid back down and closed, the platysma layer using 3- 0 Vicryl suture in a simple running  fashion.  The preauricular portion incision was closed with 5-0 nylon in simple running fashion.  The lower portion of the incision was closed with staples.  The drain was hooked to suction during closure.  At this point, the drapes were removed the patient was cleaned off.  Bacitracin ointment was added in to the incision and the drain was attached to the right shoulder.  The patient was given an in and out catheterization of his bladder after this, before awake.  He was then woken up, extubated, and moved to room stable condition.     Antony Contras, MD     DDB/MEDQ  D:  11/18/2012  T:  11/19/2012  Job:  [id number]

## 2012-11-19 NOTE — Progress Notes (Signed)
1 Day Post-Op  Subjective: Feeling pretty good.  Some right neck soreness.  Able to void his bladder.  Out of bed to chair for first time this morning.  Objective: Vital signs in last 24 hours: Temp:  [97.6 F (36.4 C)-98.5 F (36.9 C)] 97.8 F (36.6 C) (10/14 0500) Pulse Rate:  [52-91] 91 (10/14 0500) Resp:  [13-22] 18 (10/14 0500) BP: (96-151)/(49-67) 145/63 mmHg (10/14 0500) SpO2:  [99 %-100 %] 100 % (10/14 0500) Weight:  [80.74 kg (178 lb)] 80.74 kg (178 lb) (10/13 1429) Last BM Date: 11/17/12  Intake/Output from previous day: 10/13 0701 - 10/14 0700 In: 2618.8 [I.V.:2518.8; IV Piggyback:100] Out: 1182 [Urine:1000; Drains:107; Blood:75] Intake/Output this shift:    General appearance: alert, cooperative and no distress Head: Right facial movement normal except for right lower lip weakness. Neck: Right neck and preauricular incision clean and intact.  No fluid collection.  Drain functioning.  Right shoulder shrug functional but asymmetric to left side.  Lab Results:   Recent Labs  11/18/12 0703  WBC 5.7  HGB 14.1  HCT 39.9  PLT 193   BMET  Recent Labs  11/18/12 0703  NA 130*  K 4.1  CL 95*  CO2 24  GLUCOSE 105*  BUN 9  CREATININE 0.65  CALCIUM 9.0   PT/INR No results found for this basename: LABPROT, INR,  in the last 72 hours ABG No results found for this basename: PHART, PCO2, PO2, HCO3,  in the last 72 hours  Studies/Results: Dg Chest 2 View  11/18/2012   CLINICAL DATA:  Hypertension. Preop.  EXAM: CHEST  2 VIEW  COMPARISON:  None.  FINDINGS: No cardiomegaly. No evidence of aortic enlargement when accounting for tortuosity and thoracic spine endplate spurring. No adenopathy. Aortic atherosclerosis.  Eventration of the right diaphragm. No infiltrate, edema, effusion, or pneumothorax.  IMPRESSION: No evidence of active cardiopulmonary disease.   Electronically Signed   By: Tiburcio Pea M.D.   On: 11/18/2012 06:56     Anti-infectives: Anti-infectives   Start     Dose/Rate Route Frequency Ordered Stop   11/18/12 1600  ceFAZolin (ANCEF) IVPB 1 g/50 mL premix     1 g 100 mL/hr over 30 Minutes Intravenous Every 8 hours 11/18/12 1423 11/19/12 1559   11/18/12 0930  ceFAZolin (ANCEF) IVPB 2 g/50 mL premix     2 g 100 mL/hr over 30 Minutes Intravenous  Once 11/18/12 0917 11/18/12 0912      Assessment/Plan: s/p Procedure(s) with comments: RIGHT PAROTIDECTOMY (Right) RIGHT RADICAL NECK DISSECTION/Right Selective Neck Dissection   (Right) - Right Selective Neck Dissection  Doing very well.  Right lower lip and shoulder shrug seem weak at the moment but will likely improve with time.  Will leave drain in place another night.  Ambulate today.  LOS: 1 day    Katurah Karapetian 11/19/2012

## 2012-11-20 ENCOUNTER — Encounter (HOSPITAL_COMMUNITY): Payer: Self-pay | Admitting: Otolaryngology

## 2012-11-20 MED ORDER — HYDROCODONE-ACETAMINOPHEN 5-325 MG PO TABS: 1.0000 | ORAL_TABLET | ORAL | Status: DC | PRN

## 2012-11-20 NOTE — Discharge Summary (Signed)
Physician Discharge Summary  Patient ID: Ross Cameron MRN: [id number] DOB/AGE: Jul 30, 1923 79 y.o.  Admit date: 11/18/2012 Discharge date: 11/20/2012  Admission Diagnoses: Squamous cell carcinoma of right neck  Discharge Diagnoses: same   Discharged Condition: good  Hospital Course: 79 year old presented to the operating room for excision of right parotid tail squamous cell carcinoma.  For details, see the operative note.  After surgery, he was observed in a regular room with a drain in place and did quite well.  Drain output decreased by POD 2 and he was felt stable for discharge.  He is ambulating with his walker and feels stable.  Consults: None  Significant Diagnostic Studies: None  Treatments: surgery: Right superficial parotidectomy and right selective neck dissection.  Discharge Exam: Blood pressure 154/72, pulse 81, temperature 97.5 F (36.4 C), temperature source Oral, resp. rate 20, height 5' 5.25" (1.657 m), weight 80.74 kg (178 lb), SpO2 100.00%. General appearance: alert, cooperative and no distress Head: Right lower lip weakness.  Rest of facial function normal. Neck: Right neck incision clean and intact.  No fluid collection.  Drain removed.  Right shoulder shrug a bit weak.  Disposition: Final discharge disposition not confirmed  Discharge Orders   Future Orders Complete By Expires   Diet - low sodium heart healthy  As directed    Discharge instructions  As directed    Comments:     Avoid strenuous activity.  Apply Bacitracin or Neosporin to the incision twice daily.  OK to let incision get wet, gently pat dry.  Diet should be bland, avoid sour, citrus, or spicy.   Increase activity slowly  As directed        Medication List         beta carotene w/minerals tablet  Take 1 tablet by mouth daily.     HYDROcodone-acetaminophen 5-325 MG per tablet  Commonly known as:  NORCO/VICODIN  Take 1-2 tablets by mouth every 4 (four) hours as needed.     lisinopril 20 MG tablet  Commonly known as:  PRINIVIL,ZESTRIL  Take 20 mg by mouth daily.           Follow-up Information   Follow up with Daleisa Halperin, MD. Schedule an appointment as soon as possible for a visit in 1 week.   Specialty:  Otolaryngology   Contact information:   [address] [place] [address] [phone]       Signed: Christia Reading 11/20/2012, 7:58 AM

## 2012-11-20 NOTE — Progress Notes (Signed)
Discharge Note. Pt given discharge instructions with family present. Pt reports he is ready to go home and has been. Pt reminded about medications. Pt ready for discharge

## 2013-12-17 ENCOUNTER — Other Ambulatory Visit: Payer: Self-pay | Admitting: Dermatology

## 2014-02-25 DIAGNOSIS — Z859 Personal history of malignant neoplasm, unspecified: Secondary | ICD-10-CM | POA: Diagnosis not present

## 2014-05-21 DIAGNOSIS — I10 Essential (primary) hypertension: Secondary | ICD-10-CM | POA: Diagnosis not present

## 2014-05-21 DIAGNOSIS — Z125 Encounter for screening for malignant neoplasm of prostate: Secondary | ICD-10-CM | POA: Diagnosis not present

## 2014-05-21 DIAGNOSIS — Z6827 Body mass index (BMI) 27.0-27.9, adult: Secondary | ICD-10-CM | POA: Diagnosis not present

## 2014-05-21 DIAGNOSIS — Z23 Encounter for immunization: Secondary | ICD-10-CM | POA: Diagnosis not present

## 2014-05-21 DIAGNOSIS — Z79899 Other long term (current) drug therapy: Secondary | ICD-10-CM | POA: Diagnosis not present

## 2014-05-21 DIAGNOSIS — K2970 Gastritis, unspecified, without bleeding: Secondary | ICD-10-CM | POA: Diagnosis not present

## 2014-05-21 DIAGNOSIS — Z0000 Encounter for general adult medical examination without abnormal findings: Secondary | ICD-10-CM | POA: Diagnosis not present

## 2014-06-15 DIAGNOSIS — I10 Essential (primary) hypertension: Secondary | ICD-10-CM | POA: Diagnosis not present

## 2014-06-15 DIAGNOSIS — H539 Unspecified visual disturbance: Secondary | ICD-10-CM | POA: Diagnosis not present

## 2014-06-16 DIAGNOSIS — H3531 Nonexudative age-related macular degeneration: Secondary | ICD-10-CM | POA: Diagnosis not present

## 2014-06-29 DIAGNOSIS — Z859 Personal history of malignant neoplasm, unspecified: Secondary | ICD-10-CM | POA: Diagnosis not present

## 2014-11-25 DIAGNOSIS — M179 Osteoarthritis of knee, unspecified: Secondary | ICD-10-CM | POA: Diagnosis not present

## 2014-11-25 DIAGNOSIS — Z9181 History of falling: Secondary | ICD-10-CM | POA: Diagnosis not present

## 2014-11-25 DIAGNOSIS — Z1389 Encounter for screening for other disorder: Secondary | ICD-10-CM | POA: Diagnosis not present

## 2014-11-25 DIAGNOSIS — K222 Esophageal obstruction: Secondary | ICD-10-CM | POA: Diagnosis not present

## 2014-11-25 DIAGNOSIS — Z23 Encounter for immunization: Secondary | ICD-10-CM | POA: Diagnosis not present

## 2014-11-25 DIAGNOSIS — C760 Malignant neoplasm of head, face and neck: Secondary | ICD-10-CM | POA: Diagnosis not present

## 2014-11-25 DIAGNOSIS — I10 Essential (primary) hypertension: Secondary | ICD-10-CM | POA: Diagnosis not present

## 2014-11-25 DIAGNOSIS — Z139 Encounter for screening, unspecified: Secondary | ICD-10-CM | POA: Diagnosis not present

## 2014-12-14 DIAGNOSIS — H9193 Unspecified hearing loss, bilateral: Secondary | ICD-10-CM | POA: Diagnosis not present

## 2014-12-14 DIAGNOSIS — Z859 Personal history of malignant neoplasm, unspecified: Secondary | ICD-10-CM | POA: Diagnosis not present

## 2014-12-16 DIAGNOSIS — M179 Osteoarthritis of knee, unspecified: Secondary | ICD-10-CM | POA: Diagnosis not present

## 2015-04-15 DIAGNOSIS — M25461 Effusion, right knee: Secondary | ICD-10-CM | POA: Diagnosis not present

## 2015-04-15 DIAGNOSIS — M179 Osteoarthritis of knee, unspecified: Secondary | ICD-10-CM | POA: Diagnosis not present

## 2015-05-26 DIAGNOSIS — Z1389 Encounter for screening for other disorder: Secondary | ICD-10-CM | POA: Diagnosis not present

## 2015-05-26 DIAGNOSIS — I10 Essential (primary) hypertension: Secondary | ICD-10-CM | POA: Diagnosis not present

## 2015-05-26 DIAGNOSIS — N400 Enlarged prostate without lower urinary tract symptoms: Secondary | ICD-10-CM | POA: Diagnosis not present

## 2015-05-26 DIAGNOSIS — Z125 Encounter for screening for malignant neoplasm of prostate: Secondary | ICD-10-CM | POA: Diagnosis not present

## 2015-05-26 DIAGNOSIS — Z0000 Encounter for general adult medical examination without abnormal findings: Secondary | ICD-10-CM | POA: Diagnosis not present

## 2015-05-26 DIAGNOSIS — M179 Osteoarthritis of knee, unspecified: Secondary | ICD-10-CM | POA: Diagnosis not present

## 2015-05-26 DIAGNOSIS — C760 Malignant neoplasm of head, face and neck: Secondary | ICD-10-CM | POA: Diagnosis not present

## 2015-07-06 DIAGNOSIS — R1313 Dysphagia, pharyngeal phase: Secondary | ICD-10-CM | POA: Diagnosis not present

## 2015-07-06 DIAGNOSIS — H903 Sensorineural hearing loss, bilateral: Secondary | ICD-10-CM | POA: Diagnosis not present

## 2015-07-06 DIAGNOSIS — C07 Malignant neoplasm of parotid gland: Secondary | ICD-10-CM | POA: Diagnosis not present

## 2015-08-24 DIAGNOSIS — M179 Osteoarthritis of knee, unspecified: Secondary | ICD-10-CM | POA: Diagnosis not present

## 2015-10-26 DIAGNOSIS — M25512 Pain in left shoulder: Secondary | ICD-10-CM | POA: Diagnosis not present

## 2015-11-25 DIAGNOSIS — M179 Osteoarthritis of knee, unspecified: Secondary | ICD-10-CM | POA: Diagnosis not present

## 2015-11-25 DIAGNOSIS — I10 Essential (primary) hypertension: Secondary | ICD-10-CM | POA: Diagnosis not present

## 2015-11-25 DIAGNOSIS — C760 Malignant neoplasm of head, face and neck: Secondary | ICD-10-CM | POA: Diagnosis not present

## 2015-11-25 DIAGNOSIS — Z23 Encounter for immunization: Secondary | ICD-10-CM | POA: Diagnosis not present

## 2016-05-28 DIAGNOSIS — T18128A Food in esophagus causing other injury, initial encounter: Secondary | ICD-10-CM | POA: Diagnosis not present

## 2016-05-28 DIAGNOSIS — R0602 Shortness of breath: Secondary | ICD-10-CM | POA: Diagnosis not present

## 2016-05-28 DIAGNOSIS — K222 Esophageal obstruction: Secondary | ICD-10-CM | POA: Diagnosis not present

## 2016-05-31 DIAGNOSIS — M179 Osteoarthritis of knee, unspecified: Secondary | ICD-10-CM | POA: Diagnosis not present

## 2016-05-31 DIAGNOSIS — Z0000 Encounter for general adult medical examination without abnormal findings: Secondary | ICD-10-CM | POA: Diagnosis not present

## 2016-05-31 DIAGNOSIS — I10 Essential (primary) hypertension: Secondary | ICD-10-CM | POA: Diagnosis not present

## 2016-05-31 DIAGNOSIS — Z1389 Encounter for screening for other disorder: Secondary | ICD-10-CM | POA: Diagnosis not present

## 2016-05-31 DIAGNOSIS — Z125 Encounter for screening for malignant neoplasm of prostate: Secondary | ICD-10-CM | POA: Diagnosis not present

## 2016-05-31 DIAGNOSIS — R1310 Dysphagia, unspecified: Secondary | ICD-10-CM | POA: Diagnosis not present

## 2016-06-12 DIAGNOSIS — R1310 Dysphagia, unspecified: Secondary | ICD-10-CM | POA: Diagnosis not present

## 2016-06-16 DIAGNOSIS — I10 Essential (primary) hypertension: Secondary | ICD-10-CM | POA: Diagnosis not present

## 2016-06-16 DIAGNOSIS — L409 Psoriasis, unspecified: Secondary | ICD-10-CM | POA: Diagnosis not present

## 2016-06-16 DIAGNOSIS — R1310 Dysphagia, unspecified: Secondary | ICD-10-CM | POA: Diagnosis not present

## 2016-06-16 DIAGNOSIS — Z8589 Personal history of malignant neoplasm of other organs and systems: Secondary | ICD-10-CM | POA: Diagnosis not present

## 2016-06-16 DIAGNOSIS — K222 Esophageal obstruction: Secondary | ICD-10-CM | POA: Diagnosis not present

## 2016-06-16 DIAGNOSIS — Z79899 Other long term (current) drug therapy: Secondary | ICD-10-CM | POA: Diagnosis not present

## 2016-06-16 DIAGNOSIS — K449 Diaphragmatic hernia without obstruction or gangrene: Secondary | ICD-10-CM | POA: Diagnosis not present

## 2016-06-16 DIAGNOSIS — H3530 Unspecified macular degeneration: Secondary | ICD-10-CM | POA: Diagnosis not present

## 2016-07-19 DIAGNOSIS — R1310 Dysphagia, unspecified: Secondary | ICD-10-CM | POA: Diagnosis not present

## 2016-10-31 DIAGNOSIS — M179 Osteoarthritis of knee, unspecified: Secondary | ICD-10-CM | POA: Diagnosis not present

## 2016-12-07 DIAGNOSIS — Z23 Encounter for immunization: Secondary | ICD-10-CM | POA: Diagnosis not present

## 2016-12-07 DIAGNOSIS — Z8589 Personal history of malignant neoplasm of other organs and systems: Secondary | ICD-10-CM | POA: Diagnosis not present

## 2016-12-07 DIAGNOSIS — I10 Essential (primary) hypertension: Secondary | ICD-10-CM | POA: Diagnosis not present

## 2016-12-07 DIAGNOSIS — M179 Osteoarthritis of knee, unspecified: Secondary | ICD-10-CM | POA: Diagnosis not present

## 2016-12-07 DIAGNOSIS — L409 Psoriasis, unspecified: Secondary | ICD-10-CM | POA: Diagnosis not present

## 2017-01-09 DIAGNOSIS — E871 Hypo-osmolality and hyponatremia: Secondary | ICD-10-CM | POA: Diagnosis not present

## 2017-01-09 DIAGNOSIS — D649 Anemia, unspecified: Secondary | ICD-10-CM | POA: Diagnosis not present

## 2017-03-05 DIAGNOSIS — C07 Malignant neoplasm of parotid gland: Secondary | ICD-10-CM | POA: Diagnosis not present

## 2017-03-05 DIAGNOSIS — H903 Sensorineural hearing loss, bilateral: Secondary | ICD-10-CM | POA: Diagnosis not present

## 2017-06-04 DIAGNOSIS — Z1331 Encounter for screening for depression: Secondary | ICD-10-CM | POA: Diagnosis not present

## 2017-06-04 DIAGNOSIS — Z139 Encounter for screening, unspecified: Secondary | ICD-10-CM | POA: Diagnosis not present

## 2017-06-04 DIAGNOSIS — Z9181 History of falling: Secondary | ICD-10-CM | POA: Diagnosis not present

## 2017-06-04 DIAGNOSIS — Z0000 Encounter for general adult medical examination without abnormal findings: Secondary | ICD-10-CM | POA: Diagnosis not present

## 2019-02-14 ENCOUNTER — Other Ambulatory Visit: Payer: Self-pay | Admitting: Otolaryngology

## 2019-02-14 DIAGNOSIS — J3801 Paralysis of vocal cords and larynx, unilateral: Secondary | ICD-10-CM

## 2019-02-26 ENCOUNTER — Other Ambulatory Visit: Payer: Self-pay

## 2019-02-26 ENCOUNTER — Ambulatory Visit
Admission: RE | Admit: 2019-02-26 | Discharge: 2019-02-26 | Disposition: A | Discharge status: Home / Self Care | Payer: Medicare Other | Source: Ambulatory Visit | Attending: Otolaryngology | Admitting: Otolaryngology

## 2019-02-26 DIAGNOSIS — J3801 Paralysis of vocal cords and larynx, unilateral: Secondary | ICD-10-CM

## 2019-02-26 MED ORDER — IOPAMIDOL (ISOVUE-300) INJECTION 61%
75.0000 mL | Freq: Once | INTRAVENOUS | Status: AC | PRN
  Administered 2019-02-26: 16:00:00 75 mL via INTRAVENOUS

## 2019-03-03 ENCOUNTER — Other Ambulatory Visit: Payer: Self-pay | Admitting: Otolaryngology

## 2019-03-10 ENCOUNTER — Encounter (HOSPITAL_COMMUNITY): Payer: Self-pay

## 2019-03-10 NOTE — Progress Notes (Signed)
[facility], [place] - [address] [place] [address] Phone: [phone] Fax: [phone]  [id number] - [place], [place] [address] Phone: [phone] Fax: [phone]      Your procedure is scheduled on Friday Mar 14, 2019.  Report to [facility] Main Entrance "A" at 05:30 A.M., and check in at the Admitting office.  Call this number if you have problems the morning of surgery:  [phone]  Call [phone] if you have any questions prior to your surgery date Monday-Friday 8am-4pm    Remember:  Do not eat or drink after midnight the night before your surgery   Take these medicines the morning of surgery with A SIP OF WATER:  omeprazole (PRILOSEC)   7 days prior to surgery STOP taking any Aspirin (unless otherwise instructed by your surgeon), Aleve, Naproxen, Ibuprofen, Motrin, Advil, Goody's, BC's, all herbal medications, fish oil, and all vitamins.    The Morning of Surgery  Do not wear jewelry  Do not wear lotions, powders, colognes, or deodorant  Men may shave face and neck.  Do not bring valuables to the hospital.  [facility] is not responsible for any belongings or valuables.  If you are a smoker, DO NOT Smoke 24 hours prior to surgery  If you wear a CPAP at night please bring your mask the morning of surgery   Remember that you must have someone to transport you home after your surgery, and remain with you for 24 hours if you are discharged the same day.   Please bring cases for contacts, glasses, hearing aids, dentures or bridgework because it cannot be worn into surgery.    Leave your suitcase in the car.  After surgery it may be brought to your room.  For patients admitted to the hospital, discharge time will be determined by your treatment team.  Patients discharged the day of surgery will not be allowed to drive home.     Special instructions:   [employer]- Preparing For Surgery  Before surgery, you can play an important role. Because skin is not sterile, your skin needs to be as free of germs as possible. You can reduce the number of germs on your skin by washing with CHG (chlorahexidine gluconate) Soap before surgery.  CHG is an antiseptic cleaner which kills germs and bonds with the skin to continue killing germs even after washing.    Oral Hygiene is also important to reduce your risk of infection.  Remember - BRUSH YOUR TEETH THE MORNING OF SURGERY WITH YOUR REGULAR TOOTHPASTE  Please do not use if you have an allergy to CHG or antibacterial soaps. If your skin becomes reddened/irritated stop using the CHG.  Do not shave (including legs and underarms) for at least 48 hours prior to first CHG shower. It is OK to shave your face.  Please follow these instructions carefully.   1. Shower the NIGHT BEFORE SURGERY and the MORNING OF SURGERY with CHG Soap.   2. If you chose to wash your hair, wash your hair first as usual with your normal shampoo.  3. After you shampoo, rinse your hair and body thoroughly to remove the shampoo.  4. Use CHG as you would any other liquid soap. You can apply CHG directly to the skin and wash gently with a scrungie or a clean washcloth.   5. Apply the CHG  Soap to your body ONLY FROM THE NECK DOWN.  Do not use on open wounds or open sores. Avoid contact with your eyes, ears, mouth and genitals (private parts). Wash Face and genitals (private parts)  with your normal soap.   6. Wash thoroughly, paying special attention to the area where your surgery will be performed.  7. Thoroughly rinse your body with warm water from the neck down.  8. DO NOT shower/wash with your normal soap after using and rinsing off the CHG Soap.  9. Pat yourself dry with a CLEAN TOWEL.  10. Wear CLEAN PAJAMAS to bed the night before surgery, wear comfortable clothes the morning of  surgery  11. Place CLEAN SHEETS on your bed the night of your first shower and DO NOT SLEEP WITH PETS.    Day of Surgery:  Please shower the morning of surgery with the CHG soap Do not apply any deodorants/lotions. Please wear clean clothes to the hospital/surgery center.   Remember to brush your teeth WITH YOUR REGULAR TOOTHPASTE.   Please read over the following fact sheets that you were given.

## 2019-03-11 ENCOUNTER — Encounter (HOSPITAL_COMMUNITY)
Admission: RE | Admit: 2019-03-11 | Discharge: 2019-03-11 | Disposition: A | Discharge status: Home / Self Care | Payer: Medicare Other | Source: Ambulatory Visit | Attending: Otolaryngology | Admitting: Otolaryngology

## 2019-03-11 ENCOUNTER — Encounter (HOSPITAL_COMMUNITY): Payer: Self-pay

## 2019-03-11 ENCOUNTER — Other Ambulatory Visit: Payer: Self-pay

## 2019-03-11 ENCOUNTER — Encounter (HOSPITAL_COMMUNITY): Payer: Self-pay | Admitting: Vascular Surgery

## 2019-03-11 ENCOUNTER — Inpatient Hospital Stay
Admission: RE | Admit: 2019-03-11 | Discharge: 2019-03-11 | Disposition: A | Discharge status: Home / Self Care | Payer: Medicare Other | Source: Ambulatory Visit | Admitting: Radiation Oncology

## 2019-03-11 ENCOUNTER — Other Ambulatory Visit (HOSPITAL_COMMUNITY): Payer: Self-pay

## 2019-03-11 ENCOUNTER — Inpatient Hospital Stay (HOSPITAL_COMMUNITY)
Admission: EM | Admit: 2019-03-11 | Discharge: 2019-03-15 | DRG: 181 | Disposition: A | Discharge status: Hospice | Payer: Medicare Other | Attending: Internal Medicine | Admitting: Internal Medicine

## 2019-03-11 ENCOUNTER — Other Ambulatory Visit: Payer: Self-pay | Admitting: Radiation Oncology

## 2019-03-11 ENCOUNTER — Emergency Department (HOSPITAL_COMMUNITY): Payer: Medicare Other

## 2019-03-11 ENCOUNTER — Encounter (HOSPITAL_COMMUNITY): Payer: Self-pay | Admitting: Emergency Medicine

## 2019-03-11 ENCOUNTER — Inpatient Hospital Stay (HOSPITAL_COMMUNITY): Admission: RE | Admit: 2019-03-11 | Payer: Medicare Other | Source: Ambulatory Visit

## 2019-03-11 ENCOUNTER — Telehealth: Payer: Self-pay | Admitting: Emergency Medicine

## 2019-03-11 DIAGNOSIS — Z961 Presence of intraocular lens: Secondary | ICD-10-CM | POA: Diagnosis present

## 2019-03-11 DIAGNOSIS — I493 Ventricular premature depolarization: Secondary | ICD-10-CM | POA: Diagnosis present

## 2019-03-11 DIAGNOSIS — Z515 Encounter for palliative care: Secondary | ICD-10-CM | POA: Diagnosis not present

## 2019-03-11 DIAGNOSIS — R1313 Dysphagia, pharyngeal phase: Secondary | ICD-10-CM | POA: Diagnosis not present

## 2019-03-11 DIAGNOSIS — R410 Disorientation, unspecified: Secondary | ICD-10-CM | POA: Diagnosis not present

## 2019-03-11 DIAGNOSIS — J383 Other diseases of vocal cords: Secondary | ICD-10-CM | POA: Diagnosis not present

## 2019-03-11 DIAGNOSIS — Z9842 Cataract extraction status, left eye: Secondary | ICD-10-CM

## 2019-03-11 DIAGNOSIS — F419 Anxiety disorder, unspecified: Secondary | ICD-10-CM | POA: Diagnosis present

## 2019-03-11 DIAGNOSIS — Z20822 Contact with and (suspected) exposure to covid-19: Secondary | ICD-10-CM | POA: Diagnosis present

## 2019-03-11 DIAGNOSIS — M1990 Unspecified osteoarthritis, unspecified site: Secondary | ICD-10-CM | POA: Diagnosis not present

## 2019-03-11 DIAGNOSIS — F19959 Other psychoactive substance use, unspecified with psychoactive substance-induced psychotic disorder, unspecified: Secondary | ICD-10-CM | POA: Diagnosis present

## 2019-03-11 DIAGNOSIS — Z9841 Cataract extraction status, right eye: Secondary | ICD-10-CM

## 2019-03-11 DIAGNOSIS — Z66 Do not resuscitate: Secondary | ICD-10-CM | POA: Diagnosis not present

## 2019-03-11 DIAGNOSIS — Z85818 Personal history of malignant neoplasm of other sites of lip, oral cavity, and pharynx: Secondary | ICD-10-CM | POA: Diagnosis present

## 2019-03-11 DIAGNOSIS — R0603 Acute respiratory distress: Secondary | ICD-10-CM

## 2019-03-11 DIAGNOSIS — T380X5A Adverse effect of glucocorticoids and synthetic analogues, initial encounter: Secondary | ICD-10-CM | POA: Diagnosis not present

## 2019-03-11 DIAGNOSIS — H9190 Unspecified hearing loss, unspecified ear: Secondary | ICD-10-CM | POA: Diagnosis not present

## 2019-03-11 DIAGNOSIS — R1314 Dysphagia, pharyngoesophageal phase: Secondary | ICD-10-CM | POA: Diagnosis not present

## 2019-03-11 DIAGNOSIS — F09 Unspecified mental disorder due to known physiological condition: Secondary | ICD-10-CM | POA: Diagnosis not present

## 2019-03-11 DIAGNOSIS — Z886 Allergy status to analgesic agent status: Secondary | ICD-10-CM

## 2019-03-11 DIAGNOSIS — I10 Essential (primary) hypertension: Secondary | ICD-10-CM | POA: Diagnosis not present

## 2019-03-11 DIAGNOSIS — J398 Other specified diseases of upper respiratory tract: Secondary | ICD-10-CM | POA: Diagnosis present

## 2019-03-11 DIAGNOSIS — K219 Gastro-esophageal reflux disease without esophagitis: Secondary | ICD-10-CM | POA: Diagnosis present

## 2019-03-11 DIAGNOSIS — H3530 Unspecified macular degeneration: Secondary | ICD-10-CM | POA: Diagnosis present

## 2019-03-11 DIAGNOSIS — C3490 Malignant neoplasm of unspecified part of unspecified bronchus or lung: Principal | ICD-10-CM | POA: Diagnosis present

## 2019-03-11 DIAGNOSIS — E222 Syndrome of inappropriate secretion of antidiuretic hormone: Secondary | ICD-10-CM | POA: Diagnosis not present

## 2019-03-11 DIAGNOSIS — M6250 Muscle wasting and atrophy, not elsewhere classified, unspecified site: Secondary | ICD-10-CM | POA: Diagnosis present

## 2019-03-11 DIAGNOSIS — J9859 Other diseases of mediastinum, not elsewhere classified: Secondary | ICD-10-CM | POA: Diagnosis not present

## 2019-03-11 DIAGNOSIS — R0602 Shortness of breath: Secondary | ICD-10-CM | POA: Diagnosis not present

## 2019-03-11 DIAGNOSIS — J3801 Paralysis of vocal cords and larynx, unilateral: Secondary | ICD-10-CM | POA: Diagnosis present

## 2019-03-11 DIAGNOSIS — Z87891 Personal history of nicotine dependence: Secondary | ICD-10-CM

## 2019-03-11 DIAGNOSIS — R1310 Dysphagia, unspecified: Secondary | ICD-10-CM | POA: Diagnosis not present

## 2019-03-11 DIAGNOSIS — Z85820 Personal history of malignant melanoma of skin: Secondary | ICD-10-CM

## 2019-03-11 DIAGNOSIS — E871 Hypo-osmolality and hyponatremia: Secondary | ICD-10-CM | POA: Diagnosis not present

## 2019-03-11 DIAGNOSIS — Z79899 Other long term (current) drug therapy: Secondary | ICD-10-CM

## 2019-03-11 DIAGNOSIS — C7800 Secondary malignant neoplasm of unspecified lung: Secondary | ICD-10-CM | POA: Diagnosis present

## 2019-03-11 DIAGNOSIS — D649 Anemia, unspecified: Secondary | ICD-10-CM | POA: Diagnosis not present

## 2019-03-11 DIAGNOSIS — Z7189 Other specified counseling: Secondary | ICD-10-CM | POA: Diagnosis not present

## 2019-03-11 DIAGNOSIS — Y92239 Unspecified place in hospital as the place of occurrence of the external cause: Secondary | ICD-10-CM | POA: Diagnosis not present

## 2019-03-11 DIAGNOSIS — C33 Malignant neoplasm of trachea: Secondary | ICD-10-CM | POA: Diagnosis not present

## 2019-03-11 DIAGNOSIS — E860 Dehydration: Secondary | ICD-10-CM | POA: Diagnosis present

## 2019-03-11 HISTORY — DX: Depression, unspecified: F32.A

## 2019-03-11 HISTORY — DX: Anxiety disorder, unspecified: F41.9

## 2019-03-11 HISTORY — DX: Anemia, unspecified: D64.9

## 2019-03-11 HISTORY — DX: Other amnesia: R41.3

## 2019-03-11 HISTORY — DX: Malignant (primary) neoplasm, unspecified: C80.1

## 2019-03-11 HISTORY — DX: Constipation, unspecified: K59.00

## 2019-03-11 LAB — COMPREHENSIVE METABOLIC PANEL
ALT: 18 U/L (ref 0–44)
AST: 21 U/L (ref 15–41)
Albumin: 3.4 g/dL — ABNORMAL LOW (ref 3.5–5.0)
Alkaline Phosphatase: 80 U/L (ref 38–126)
Anion gap: 10 (ref 5–15)
BUN: 7 mg/dL — ABNORMAL LOW (ref 8–23)
CO2: 25 mmol/L (ref 22–32)
Calcium: 9.5 mg/dL (ref 8.9–10.3)
Chloride: 95 mmol/L — ABNORMAL LOW (ref 98–111)
Creatinine, Ser: 0.73 mg/dL (ref 0.61–1.24)
GFR calc Af Amer: >60 mL/min (ref >60)
GFR calc non Af Amer: >60 mL/min (ref >60)
Glucose, Bld: 108 mg/dL — ABNORMAL HIGH (ref 70–99)
Potassium: 4.9 mmol/L (ref 3.5–5.1)
Sodium: 130 mmol/L — ABNORMAL LOW (ref 135–145)
Total Bilirubin: 0.8 mg/dL (ref 0.3–1.2)
Total Protein: 6.4 g/dL — ABNORMAL LOW (ref 6.5–8.1)

## 2019-03-11 LAB — RESPIRATORY PANEL BY RT PCR (FLU A&B, COVID)
Influenza A by PCR: NEGATIVE
Influenza B by PCR: NEGATIVE
SARS Coronavirus 2 by RT PCR: NEGATIVE

## 2019-03-11 LAB — CBC
HCT: 35.9 % — ABNORMAL LOW (ref 39.0–52.0)
Hemoglobin: 11.7 g/dL — ABNORMAL LOW (ref 13.0–17.0)
MCH: 29.5 pg (ref 26.0–34.0)
MCHC: 32.6 g/dL (ref 30.0–36.0)
MCV: 90.7 fL (ref 80.0–100.0)
Platelets: 257 10*3/uL (ref 150–400)
RBC: 3.96 MIL/uL — ABNORMAL LOW (ref 4.22–5.81)
RDW: 13.2 % (ref 11.5–15.5)
WBC: 8 10*3/uL (ref 4.0–10.5)
nRBC: 0 % (ref 0.0–0.2)

## 2019-03-11 MED ORDER — PANTOPRAZOLE SODIUM 40 MG PO TBEC
40.0000 mg | DELAYED_RELEASE_TABLET | Freq: Every day | ORAL | Status: DC
  Administered 2019-03-13 – 2019-03-15 (×3): 40 mg via ORAL
  Filled 2019-03-11 (×3): qty 1

## 2019-03-11 MED ORDER — DEXAMETHASONE SODIUM PHOSPHATE 10 MG/ML IJ SOLN
10.0000 mg | Freq: Three times a day (TID) | INTRAMUSCULAR | Status: DC
  Administered 2019-03-11 – 2019-03-12 (×4): 10 mg via INTRAVENOUS
  Administered 2019-03-13: 5 mg via INTRAVENOUS
  Filled 2019-03-11 (×5): qty 1

## 2019-03-11 MED ORDER — ONDANSETRON HCL 4 MG/2ML IJ SOLN: 4.0000 mg | Freq: Four times a day (QID) | INTRAMUSCULAR | Status: DC | PRN

## 2019-03-11 MED ORDER — RACEPINEPHRINE HCL 2.25 % IN NEBU
0.2500 mL | INHALATION_SOLUTION | Freq: Once | RESPIRATORY_TRACT | Status: AC
  Administered 2019-03-11: 14:00:00 0.25 mL via RESPIRATORY_TRACT
  Filled 2019-03-11: qty 0.5

## 2019-03-11 MED ORDER — SODIUM CHLORIDE 0.9 % IV SOLN: INTRAVENOUS | Status: DC

## 2019-03-11 MED ORDER — RACEPINEPHRINE HCL 2.25 % IN NEBU
0.2500 mL | INHALATION_SOLUTION | Freq: Once | RESPIRATORY_TRACT | Status: AC
  Administered 2019-03-11: 0.25 mL via RESPIRATORY_TRACT
  Filled 2019-03-11: qty 0.5

## 2019-03-11 MED ORDER — RACEPINEPHRINE HCL 2.25 % IN NEBU: 0.2500 mL | INHALATION_SOLUTION | RESPIRATORY_TRACT | Status: DC | PRN

## 2019-03-11 MED ORDER — DEXAMETHASONE SODIUM PHOSPHATE 10 MG/ML IJ SOLN
10.0000 mg | Freq: Once | INTRAMUSCULAR | Status: AC
  Administered 2019-03-11: 12:00:00 10 mg via INTRAVENOUS
  Filled 2019-03-11: qty 1

## 2019-03-11 MED ORDER — ACETAMINOPHEN 650 MG RE SUPP: 650.0000 mg | Freq: Four times a day (QID) | RECTAL | Status: DC | PRN

## 2019-03-11 MED ORDER — ACETAMINOPHEN 325 MG PO TABS: 650.0000 mg | ORAL_TABLET | Freq: Four times a day (QID) | ORAL | Status: DC | PRN

## 2019-03-11 MED ORDER — ONDANSETRON HCL 4 MG PO TABS: 4.0000 mg | ORAL_TABLET | Freq: Four times a day (QID) | ORAL | Status: DC | PRN

## 2019-03-11 MED ORDER — LORAZEPAM 2 MG/ML IJ SOLN
0.2500 mg | INTRAMUSCULAR | Status: DC | PRN
  Administered 2019-03-11: 0.25 mg via INTRAVENOUS
  Filled 2019-03-11: qty 1

## 2019-03-11 NOTE — Pre-Procedure Instructions (Signed)
Ross Cameron  03/11/2019    Your procedure is scheduled on Friday, March 14, 2019 at 7:30 AM.   Report to [facility] Entrance "A" Admitting Office at 5:30 AM.   Call this number if you have problems the morning of surgery: [phone]   Questions prior to day of surgery, please call [phone] between 8 & 4 PM.   Remember:  Do not eat or drink after midnight Thursday, 03/13/19.  Take these medicines the morning of surgery with A SIP OF WATER: Omeprazole (Prilosec)  Do not use Aspirin products (BC Powders, Goody's, etc), NSAIDS (Ibuprofen, Aleve, etc), Herbal medications, Multivitamins or Fish Oil prior to surgery.    Do not wear jewelry.  Do not wear lotions, powders, cologne or deodorant.  Men may shave face and neck.  Do not bring valuables to the hospital.  [facility] is not responsible for any belongings or valuables.  Contacts, dentures or bridgework may not be worn into surgery.  Leave your suitcase in the car.  After surgery it may be brought to your room.  For patients admitted to the hospital, discharge time will be determined by your treatment team.  Patients discharged the day of surgery will not be allowed to drive home.   [facility] - Preparing for Surgery  Before surgery, you can play an important role.  Because skin is not sterile, your skin needs to be as free of germs as possible.  You can reduce the number of germs on you skin by washing with CHG (chlorahexidine gluconate) soap before surgery.  CHG is an antiseptic cleaner which kills germs and bonds with the skin to continue killing germs even after washing.  Oral Hygiene is also important in reducing the risk of infection.  Remember to brush your teeth with your regular toothpaste the morning of surgery.  Please DO NOT use if you have an allergy to CHG or antibacterial soaps.  If your skin becomes reddened/irritated stop using the CHG and inform your nurse when you arrive at Short Stay.  Do  not shave (including legs and underarms) for at least 48 hours prior to the first CHG shower.  You may shave your face.  Please follow these instructions carefully:   1.  Shower with CHG Soap the night before surgery and the morning of Surgery.  2.  If you choose to wash your hair, wash your hair first as usual with your normal shampoo.  3.  After you shampoo, rinse your hair and body thoroughly to remove the shampoo. 4.  Use CHG as you would any other liquid soap.  You can apply chg directly to the skin and wash gently with a      scrungie or washcloth.           5.  Apply the CHG Soap to your body ONLY FROM THE NECK DOWN.   Do not use on open wounds or open sores. Avoid contact with your eyes, ears, mouth and genitals (private parts).  Wash genitals (private parts) with your normal soap - do this prior to using CHG soap.  6.  Wash thoroughly, paying special attention to the area where your surgery will be performed.  7.  Thoroughly rinse your body with warm water from the neck down.  8.  DO NOT shower/wash with your normal soap after using and rinsing off the CHG Soap.  9.  Pat yourself dry with a clean towel.  10.  Wear clean pajamas.            11.  Place clean sheets on your bed the night of your first shower and do not sleep with pets.  Day of Surgery  Shower as above. Do not apply any lotions/deodorants the morning of surgery.   Please wear clean clothes to the hospital. Remember to brush your teeth with toothpaste.  Please read over the fact sheets that you were given.

## 2019-03-11 NOTE — ED Notes (Signed)
This nurse was called into pt's room by daughter, pt coughing up bloody sputum, states that he can't breathe-- hoarse voice, muffled voice. Dr. Sedonia Small notified

## 2019-03-11 NOTE — Progress Notes (Signed)
   Subjective:    Patient ID: Ross Cameron, male    DOB: 22-Apr-1923, 86 y.o.   MRN: [id number]  HPI He presented for preop assessment today and was found to be struggling more to breathe and was tachycardic.  He was sent to the ER.  He complains of difficulty breathing.  Review of Systems     Objective:   Physical Exam AF VSS Alert. Hoarseness. Dyspneic with increased work of breathing.     Assessment & Plan:  Tracheal mass, left vocal cord paralysis  Patient will need admission.  I will reach pulmonology to consider biopsy via bronchoscopy.  I have discussed his case with radiation oncology who will assess and may consider empiric, palliative radiation.  We will leave the vocal fold paralysis untreated for the time-being.

## 2019-03-11 NOTE — H&P (Signed)
History and Physical    Ross Cameron QQP:[id number] DOB: 03/06/1923 DOA: 03/11/2019  Referring MD/NP/PA: Gerlene Fee, MD PCP: Cyndi Bender, PA-C  Patient coming from: Home  Chief Complaint: Difficulty breathing  I have personally briefly reviewed patient's old medical records in [place]   HPI: Ross Cameron is a 86 y.o. 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  He presents with complaints of progressively worsening shortness of breath in the last 2 weeks.  Originally he was supposed to undergo biopsy of the tracheal mass mass, but was seen to be significantly short of breath in the preop setting and sent to the ED for further evaluation.  Patient reports that he has been having difficulty swallowing food and some of his medication.  He is unable to lay flat due to worsening of shortness of breath symptoms.  His daughter reports that they have been giving him a soft diet and Ensure.  Patient has been constantly coughing and trying to clear his throat.  He has been coughing up blood-tinged sputum for several days now and his voice has been hoarse for quite some time.  ED Course: Upon admission into the emergency department patient was noted to be afebrile with tachycardia and tachypnea.  O2 saturations were maintained, but patient was placed on 2 L of nasal cannula oxygen for comfort.  Chest x-ray revealed atherosclerosis, but no other acute signs of disease.  Influenza and COVID-19 screening were both negative.  Dr. Redmond Baseman of ENT evaluated the patient in the emergency department.  Consults to PCCM for possible biopsy and radiation oncology for palliative radiation were made.  Patient was given racemic epinephrine nebs and 10 mg of Decadron IV.  TRH called to admit.  A CT scan of the chest with contrast was ordered.  Review of Systems    Unable to perform ROS: Medical condition  Constitutional: Negative for fever.  HENT: Positive for hearing loss.        Positive for hoarse voice  Respiratory: Positive for cough, hemoptysis, shortness of breath and stridor.     Past Medical History:  Diagnosis Date  . Anemia   . Anxiety    not treated  . Arthritis   . Cancer ([place])    neck cancer, skin cancer - melanoma (ear)  . Complication of anesthesia    Pt has a problem with breathing and swallowing when laying flat on his back  . Constipation    uses prune juice  . Depression   . GERD (gastroesophageal reflux disease)   . HOH (hard of hearing)    Hx; of does not wear hearing aids  . Hypertension   . Macular degeneration    Hx: of  . Memory deficits    per daughter  . Psoriasis    Hx: of    Past Surgical History:  Procedure Laterality Date  . APPENDECTOMY    . CATARACT EXTRACTION W/ INTRAOCULAR LENS  IMPLANT, BILATERAL     Hx: of  . COLONOSCOPY W/ BIOPSIES AND POLYPECTOMY     Hx: of  . PAROTIDECTOMY Right 11/18/2012   Procedure: RIGHT PAROTIDECTOMY;  Surgeon: Melida Quitter, MD;  Location: [facility];  Service: ENT;  Laterality: Right;  . RADICAL NECK DISSECTION Right 11/18/2012   Procedure: RIGHT RADICAL NECK DISSECTION/Right Selective Neck Dissection  ;  Surgeon: Melida Quitter, MD;  Location: [facility];  Service: ENT;  Laterality: Right;  Right Selective Neck Dissection  83mins  . TONSILLECTOMY       reports that he has quit smoking. His smoking use included pipe and cigars. He has never used smokeless tobacco. He reports previous alcohol use. He reports that he does not use drugs.  Allergies  Allergen Reactions  . Aspirin     Causes bleeding    Family History  Problem Relation Age of Onset  . Cancer Other     Prior to Admission medications   Medication Sig Start Date End Date Taking? Authorizing Provider  lisinopril (PRINIVIL,ZESTRIL) 20 MG tablet Take 20 mg by mouth daily.   Yes [provider]   omeprazole (PRILOSEC) 20 MG capsule Take 20 mg by mouth daily as needed (heartburn/indigestion).    Yes [provider]    Physical Exam:  Constitutional: Frail elderly male who appears to be in some distress currently coughing up blood tinged sputum Vitals:   03/11/19 1248 03/11/19 1300 03/11/19 1330 03/11/19 1400  BP: (!) 155/83 139/83    Pulse: 87  97 (!) 108  Resp: 15 18 15  (!) 24  Temp:      TempSrc:      SpO2: 100%  100% 97%   Eyes: PERRL, lids and conjunctivae normal ENMT: Mucous membranes are moist. Posterior pharynx clear of any exudate or lesions. Hard of hearing. Neck: normal, supple, no masses, no thyromegaly.  No stridor appreciated at this time.  Hoarseness of voice. Respiratory: clear to auscultation bilaterally, no wheezing, no crackles. Normal respiratory effort. No accessory muscle use. Cardiovascular: Tachycardic, no murmurs / rubs / gallops. No extremity edema. 2+ pedal pulses. No carotid bruits.  Abdomen: no tenderness, no masses palpated. No hepatosplenomegaly. Bowel sounds positive.  Musculoskeletal: no clubbing / cyanosis. No joint deformity upper and lower extremities. Good ROM, no contractures. Normal muscle tone.  Skin: no rashes, lesions, ulcers. No induration Neurologic: CN 2-12 grossly intact. Sensation intact, DTR normal. Strength 5/5 in all 4.  Psychiatric: Normal judgment and insight. Alert and oriented x 3. Normal mood.     Labs on Admission: I have personally reviewed following labs and imaging studies  CBC: Recent Labs  Lab 03/11/19 1044  WBC 8.0  HGB 11.7*  HCT 35.9*  MCV 90.7  PLT 616   Basic Metabolic Panel: Recent Labs  Lab 03/11/19 1044  NA 130*  K 4.9  CL 95*  CO2 25  GLUCOSE 108*  BUN 7*  CREATININE 0.73  CALCIUM 9.5   GFR: Estimated Creatinine Clearance: 50.7 mL/min (by C-G formula based on SCr of 0.73 mg/dL). Liver Function Tests: Recent Labs  Lab 03/11/19 1044  AST 21  ALT 18  ALKPHOS 80  BILITOT  0.8  PROT 6.4*  ALBUMIN 3.4*   No results for input(s): LIPASE, AMYLASE in the last 168 hours. No results for input(s): AMMONIA in the last 168 hours. Coagulation Profile: No results for input(s): INR, PROTIME in the last 168 hours. Cardiac Enzymes: No results for input(s): CKTOTAL, CKMB, CKMBINDEX, TROPONINI in the last 168 hours. BNP (last 3 results) No results for input(s): PROBNP in the last 8760 hours. HbA1C: No results for input(s): HGBA1C in the last 72 hours. CBG: No results for input(s): GLUCAP in the last 168 hours. Lipid Profile: No results for input(s): CHOL, HDL, LDLCALC, TRIG, CHOLHDL, LDLDIRECT in the last 72 hours. Thyroid Function Tests: No results for input(s): TSH, T4TOTAL, FREET4, T3FREE, THYROIDAB in the last 72 hours. Anemia Panel: No results for input(s):  VITAMINB12, FOLATE, FERRITIN, TIBC, IRON, RETICCTPCT in the last 72 hours. Urine analysis: No results found for: COLORURINE, APPEARANCEUR, LABSPEC, PHURINE, GLUCOSEU, HGBUR, BILIRUBINUR, KETONESUR, PROTEINUR, UROBILINOGEN, NITRITE, LEUKOCYTESUR Sepsis Labs: Recent Results (from the past 240 hour(s))  Respiratory Panel by RT PCR (Flu A&B, Covid) - Nasopharyngeal Swab     Status: None   Collection Time: 03/11/19 12:10 PM   Specimen: Nasopharyngeal Swab  Result Value Ref Range Status   SARS Coronavirus 2 by RT PCR NEGATIVE NEGATIVE Final    Comment: (NOTE) SARS-CoV-2 target nucleic acids are NOT DETECTED. The SARS-CoV-2 RNA is generally detectable in upper respiratoy specimens during the acute phase of infection. The lowest concentration of SARS-CoV-2 viral copies this assay can detect is 131 copies/mL. A negative result does not preclude SARS-Cov-2 infection and should not be used as the sole basis for treatment or other patient management decisions. A negative result may occur with  improper specimen collection/handling, submission of specimen other than nasopharyngeal swab, presence of viral mutation(s)  within the areas targeted by this assay, and inadequate number of viral copies (<131 copies/mL). A negative result must be combined with clinical observations, patient history, and epidemiological information. The expected result is Negative. Fact Sheet for Patients:  PinkCheek.be Fact Sheet for Healthcare Providers:  GravelBags.it This test is not yet ap proved or cleared by the [place] FDA and  has been authorized for detection and/or diagnosis of SARS-CoV-2 by FDA under an Emergency Use Authorization (EUA). This EUA will remain  in effect (meaning this test can be used) for the duration of the COVID-19 declaration under Section 564(b)(1) of the Act, 21 U.S.C. section 360bbb-3(b)(1), unless the authorization is terminated or revoked sooner.    Influenza A by PCR NEGATIVE NEGATIVE Final   Influenza B by PCR NEGATIVE NEGATIVE Final    Comment: (NOTE) The Xpert Xpress SARS-CoV-2/FLU/RSV assay is intended as an aid in  the diagnosis of influenza from Nasopharyngeal swab specimens and  should not be used as a sole basis for treatment. Nasal washings and  aspirates are unacceptable for Xpert Xpress SARS-CoV-2/FLU/RSV  testing. Fact Sheet for Patients: PinkCheek.be Fact Sheet for Healthcare Providers: GravelBags.it This test is not yet approved or cleared by the [place] FDA and  has been authorized for detection and/or diagnosis of SARS-CoV-2 by  FDA under an Emergency Use Authorization (EUA). This EUA will remain  in effect (meaning this test can be used) for the duration of the  Covid-19 declaration under Section 564(b)(1) of the Act, 21  U.S.C. section 360bbb-3(b)(1), unless the authorization is  terminated or revoked. Performed at [facility], [place] [address] [place], [place] [address]      Radiological Exams on Admission: DG Chest Port 1  View  Result Date: 03/11/2019 CLINICAL DATA:  Shortness of breath for a few months. Preoperative examination. EXAM: PORTABLE CHEST 1 VIEW COMPARISON:  PA and lateral chest 09/27/2018. FINDINGS: Lungs are clear. Heart size is normal. No pneumothorax or pleural fluid. Aortic atherosclerosis is noted. No acute or focal bony abnormality. IMPRESSION: No acute disease. Atherosclerosis. Electronically Signed   By: Inge Rise M.D.   On: 03/11/2019 11:59    EKG: Independently reviewed.  Sinus tachycardia 111 bpm with premature atrial complexes.  Assessment/Plan Tracheal mass with left vocal cord paralysis: Patient presents with difficulty breathing due to tracheal mass.  CT scan of the neck from 1/20 \ noted the left lateral tracheal mass extending into the mediastinum and possibly into the esophagus.  Dr. Redmond Baseman of ENT consulted and consulted PCCM for possible biopsy biopsy and radiation oncology for possible palliative radiation -Admit to a progressive bed -Aspiration precautions -Elevate head of bed -Racemic epinephrine as needed -Decadron 10 mg IV 3 times daily, adjust dose per oncology recommendation -Follow-up CT scan of the chest when able to be obtained -Appreciate ENT, PCCM, and radiation oncology -Consult palliative care for management in the outpatient setting  Dysphagia: Secondary to tracheal mass. -Speech therapy to evaluate and give recommendations  Essential hypertension: Patient normally on lisinopril 20 mg daily. -Consider restarting when medically appropriate  Normocytic anemia: Hemoglobin 11.7.  Patient has been coughing up blood-tinged sputum, but no gross hemoptysis appreciated -Continue to monitor  Hyponatremia: Sodium 130 on admission.  Suspect that this could be secondary to malignancy and/or dehydration. -Normal saline at 75 mL/h overnight -Recheck sodium levels in a.m.  Anxiety: Patient not on anything for anxiety at home. -Ativan IV as needed  DVT prophylaxis:  SCD Code Status: Full for now.  Family wants to discuss this more. Family Communication: Discussed plan of care with the patient's daughter present at bedside Disposition Plan: To be determined Consults called: ENT, PCCM, radiation oncology Admission status: Inpatient  Norval Morton MD [facility] Pager [phone]   If 7PM-7AM, please contact night-coverage [email or web address] Password [email or web address]  03/11/2019, 2:18 PM

## 2019-03-11 NOTE — ED Provider Notes (Signed)
[facility] Emergency Department Provider Note MRN:  [id number]  Arrival date & time: 03/11/19     Chief Complaint   Shortness of Breath   History of Present Illness   Ross Cameron is a 86 y.o. year-old male with a history of vocal cord dysfunction, head neck cancer presenting to the ED with chief complaint of shortness of breath.  Worsening shortness of breath for the past 1 to 2 weeks.  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  Was being evaluated for preop today, found to be in respiratory distress, sent here for evaluation.  Denies pain, repeatedly states that he cannot breathe.  Review of Systems  A complete 10 system review of systems was obtained and all systems are negative except as noted in the HPI and PMH.   Patient's Health History    Past Medical History:  Diagnosis Date  . Anemia   . Anxiety    not treated  . Arthritis   . Cancer ([place])    neck cancer, skin cancer - melanoma (ear)  . Complication of anesthesia    Pt has a problem with breathing and swallowing when laying flat on his back  . Constipation    uses prune juice  . Depression   . GERD (gastroesophageal reflux disease)   . HOH (hard of hearing)    Hx; of does not wear hearing aids  . Hypertension   . Macular degeneration    Hx: of  . Memory deficits    per daughter  . Psoriasis    Hx: of    Past Surgical History:  Procedure Laterality Date  . APPENDECTOMY    . CATARACT EXTRACTION W/ INTRAOCULAR LENS  IMPLANT, BILATERAL     Hx: of  . COLONOSCOPY W/ BIOPSIES AND POLYPECTOMY     Hx: of  . PAROTIDECTOMY Right 11/18/2012   Procedure: RIGHT PAROTIDECTOMY;  Surgeon: Melida Quitter, MD;  Location: [place];  Service: ENT;  Laterality: Right;  . RADICAL NECK DISSECTION Right 11/18/2012   Procedure: RIGHT RADICAL NECK DISSECTION/Right Selective Neck Dissection  ;  Surgeon: Melida Quitter, MD;  Location: [place];  Service: ENT;  Laterality:  Right;  Right Selective Neck Dissection  49mins  . TONSILLECTOMY      Family History  Problem Relation Age of Onset  . Cancer Other     Social History   Socioeconomic History  . Marital status: Married    Spouse name: Not on file  . Number of children: Not on file  . Years of education: Not on file  . Highest education level: Not on file  Occupational History  . Not on file  Tobacco Use  . Smoking status: Former Smoker    Types: Pipe, Landscape architect  . Smokeless tobacco: Never Used  . Tobacco comment: "smoked a pipe many years ago"  Substance and Sexual Activity  . Alcohol use: Not Currently    Comment: none in the past 3 years (03/11/19)4 drinks of "[employer] daily; totals 4 ounces daily"  . Drug use: No  . Sexual activity: Not on file  Other Topics Concern  . Not on file  Social History Narrative  . Not on file   Social Determinants of Health   Financial Resource Strain:   . Difficulty of Paying Living Expenses: Not on file  Food Insecurity:   . Worried About Charity fundraiser in the Last Year: Not on file  .  Ran Out of Food in the Last Year: Not on file  Transportation Needs:   . Lack of Transportation (Medical): Not on file  . Lack of Transportation (Non-Medical): Not on file  Physical Activity:   . Days of Exercise per Week: Not on file  . Minutes of Exercise per Session: Not on file  Stress:   . Feeling of Stress : Not on file  Social Connections:   . Frequency of Communication with Friends and Family: Not on file  . Frequency of Social Gatherings with Friends and Family: Not on file  . Attends Religious Services: Not on file  . Active Member of Clubs or Organizations: Not on file  . Attends Archivist Meetings: Not on file  . Marital Status: Not on file  Intimate Partner Violence:   . Fear of Current or Ex-Partner: Not on file  . Emotionally Abused: Not on file  . Physically Abused: Not on file  . Sexually Abused: Not on file     Physical  Exam   Vitals:   03/11/19 1400 03/11/19 1445  BP:  131/77  Pulse: (!) 108 70  Resp: (!) 24 19  Temp:    SpO2: 97% 95%    CONSTITUTIONAL: Chronically ill-appearing, NAD NEURO:  Alert and oriented x 3, no focal deficits EYES:  eyes equal and reactive ENT/NECK:  no LAD, no JVD CARDIO: Regular rate, well-perfused, normal S1 and S2 PULM: Sitting upright, stridor, hoarse voice GI/GU:  normal bowel sounds, non-distended, non-tender MSK/SPINE:  No gross deformities, no edema SKIN:  no rash, atraumatic PSYCH:  Appropriate speech and behavior  *Additional and/or pertinent findings included in MDM below  Diagnostic and Interventional Summary    EKG Interpretation  Date/Time:    Ventricular Rate:    PR Interval:    QRS Duration:   QT Interval:    QTC Calculation:   R Axis:     Text Interpretation:        Cardiac Monitoring Interpretation:  Labs Reviewed  RESPIRATORY PANEL BY RT PCR (FLU A&B, COVID)    DG Chest Port 1 View  Final Result    CT CHEST W CONTRAST    (Results Pending)    Medications  LORazepam (ATIVAN) injection 0.25 mg (has no administration in time range)  0.9 %  sodium chloride infusion (has no administration in time range)  acetaminophen (TYLENOL) tablet 650 mg (has no administration in time range)    Or  acetaminophen (TYLENOL) suppository 650 mg (has no administration in time range)  ondansetron (ZOFRAN) tablet 4 mg (has no administration in time range)    Or  ondansetron (ZOFRAN) injection 4 mg (has no administration in time range)  Racepinephrine HCl 2.25 % nebulizer solution 0.25 mL (has no administration in time range)  dexamethasone (DECADRON) injection 10 mg (10 mg Intravenous Given 03/11/19 1156)  Racepinephrine HCl 2.25 % nebulizer solution 0.25 mL (0.25 mLs Nebulization Given 03/11/19 1159)  Racepinephrine HCl 2.25 % nebulizer solution 0.25 mL (0.25 mLs Nebulization Given 03/11/19 1423)     Procedures  /  Critical Care .Critical Care Performed  by: Maudie Flakes, MD Authorized by: Maudie Flakes, MD   Critical care provider statement:    Critical care time (minutes):  33   Critical care was necessary to treat or prevent imminent or life-threatening deterioration of the following conditions:  Respiratory failure (Critical airway management)   Critical care was time spent personally by me on the following activities:  Discussions with consultants,  evaluation of patient's response to treatment, examination of patient, ordering and performing treatments and interventions, ordering and review of laboratory studies, ordering and review of radiographic studies, pulse oximetry, re-evaluation of patient's condition, obtaining history from patient or surrogate and review of old charts    ED Course and Medical Decision Making  I have reviewed the triage vital signs, the nursing notes, and pertinent available records from the EMR.  Pertinent labs & imaging results that were available during my care of the patient were reviewed by me and considered in my medical decision making (see below for details).     Worsening symptoms of tracheal tumor, stridor, appears uncomfortable.  Slowly worsening over the past few weeks.  Dr. Redmond Baseman of ENT at bedside, will coordinate with pulmonology for possible bronchoscopy for biopsy with sedation other than for management.  In the meantime will provide Decadron, racemic epi, monitor closely.  Oxygen saturations 100%.  3:30 PM update: Discussed plan with Dr. Redmond Baseman of ENT, will admit to stepdown unit for close observation while we continue to formulate a plan with pulmonology.  Accepted for admission by Dr. Tamala Julian of [facility]ad.  Barth Kirks. Sedonia Small, [place] [email or web address]  Final Clinical Impressions(s) / ED Diagnoses     ICD-10-CM   1. Mediastinal mass  J98.59   2. Respiratory distress  R06.03     ED Discharge Orders    None       Discharge  Instructions Discussed with and Provided to Patient:   Discharge Instructions   None       Maudie Flakes, MD 03/11/19 1538

## 2019-03-11 NOTE — Progress Notes (Addendum)
Anesthesia PAT Evaluation:    Case: [id number] Date/Time: 03/14/19 0715   Procedures:      RIGID ESOPHAGOSCOPY W/ BIOPSY (N/A )     MICROLARYNGOSCOPY WITH VOCAL CORD INJECTION OF PROLARYN (Bilateral )   Anesthesia type: General   Pre-op diagnosis: pharyngeal dysphagia, vocal cord paralysis   Location: [facility] OR ROOM 08 / [place] [facility]   Surgeons: Melida Quitter, MD      DISCUSSION: Patient is a 86 year old male with history of right parotid cancer s/p surgery 2014 who is scheduled for the above procedure. Seen by Dr. Redmond Baseman on 02/05/19 for left vocal cord paralysis associated with dysphonia and dysphagia. Fiberoptic Laryngoscope showed: "Findings included normal nasal passages, no mass or abnormality in the nasopharynx, and no mass or ulceration in the pharynx or larynx. Pyriform sinuses are open. Secretions are minimal. Vocal folds are without mass, scarring, or ulceration. The left vocal fold is immobile and in paramedian position. There is a small glottal gap with phonation. Muscle tension patterns are not present. Laryngeal edema is minimal." Mr. Klippel is limited with lying flat, so neck CT at up to 30 degrees ordered which demonstrated a left tracheal mass that is felt to likely represent a cancer and the cause of his symptoms. Dr. Redmond Baseman recommended a biopsy at the time of vocal fold injection augmentation. Treatment is likely palliative for improvement of swallowing and breathing. He notes that "His breathing is noisy but he is not obstructed at the glottic level with good right-sided abduction." He called patient's PCP Cyndi Bender "who felt that he should be fine to undergo either approach for biopsy given his overall health status."    History includes former smoker, HTN, GERD, psoriasis, hard of hearing, macular degeneration, right parotid cancer (squamous cell carcinoma; s/p right superficial parotid ectomy with right selective neck dissection 11/19/12).  Patient evaluated at his 03/11/19 10:00 AM PAT  visit. He is very hard of hearing. He cannot tolerate lying flat and has been sleeping in a recliner--but says he gets very little sleep because "I can't breathe!". He is limiting medications due to dysphagia (taking lisinopril only) and can only eat soft foods. His voice quality is poor, low. Conversational dyspnea noted. While at PAT, he had fairly persistent coughing with intermittent audible wheeze/stridorous sounding that was exacerbated by any talking. History was somewhat difficult to get from patient, but apparently with some degree of these symptoms for about two years which have worsened. It sounds likes his breathing is at least a little worse since he saw Dr. Redmond Baseman on 02/05/20, but does not really articulate any acute changes in his breathing.  He lives with his wife, and his daughters assist as well. One of his daughters was with him at PAT and confirms that breathing does not seem to be acutely worse at PAT from the last few weeks (although admits that his breathing makes her "nervous" and wishes surgery could be moved up)  although had seemed worse about three weeks ago when he was treated with a steroid taper for "bronchitis". Reportedly had a negative COVID-19 test at that time. His daughter does check a pulse oximetry at least once daily and says mostly >98% (sometimes lower in the morning, but not significantly). O2 sat 100% with HR was 99 bpm on arrival. With even minimal conversation, HR was up to ~ 130 bpm. Mildly tachypneic (RR 25). He was sitting in wheelchair. Unable to do much activity due to SOB. Heart rhythm regular with occasional ectopic  beat. Lungs sounds diminished with expiratory wheezes, although primarily seems to be upper airway wheezing. Discussed findings with anesthesiologist Nolon Nations, MD who also examined Mr. Riel. He contacted Dr. Redmond Baseman with recommendation to sent patient to the ED for further evaluation.  I transported patient to the ED via wheelchair with daughter  at sided, and stayed with until he was checked in at the triage desk.   Preliminary ED notes reviewed. Given Decadron, racemic epi. Dr. Redmond Baseman also in to see patient, patient to be admitted and pulmonology consulted to consider biopsy via bronchoscopy. He has discussed potential for empiric, palliative radiation. Dr. Redmond Baseman will leave Mr. Habib vocal fold paralysis untreated for now.      VS: BP (!) 153/70   Pulse 99   Temp 36.6 C (Oral)   Resp (!) 25   Ht 5\' 7"  (1.702 m)   Wt 63.5 kg   SpO2 100%   BMI 21.93 kg/m    PROVIDERS: Cyndi Bender, PA-C is PCP ([facility] in [place], [place])   LABS: PAT Labs reviewed: Acceptable for surgery. ED has also ordered a Respiratory Panel, but that is still in process. (all labs ordered are listed, but only abnormal results are displayed)  Labs Reviewed  COMPREHENSIVE METABOLIC PANEL - Abnormal; Notable for the following components:      Result Value   Sodium 130 (*)    Chloride 95 (*)    Glucose, Bld 108 (*)    BUN 7 (*)    Total Protein 6.4 (*)    Albumin 3.4 (*)    All other components within normal limits  CBC - Abnormal; Notable for the following components:   RBC 3.96 (*)    Hemoglobin 11.7 (*)    HCT 35.9 (*)    All other components within normal limits     IMAGES: CT soft tissue neck 02/26/19: IMPRESSION: - Paralysis left vocal cord. - Bulky mass left lateral trachea extending into the mediastinum and possibly into the esophagus. This is consistent with carcinoma. 14 mm lymph node in the left lower neck lateral to the thyroid and jugular vein which could be malignant adenopathy. - Also noted is a 16 mm pre carina lymph node.   CXR 09/27/18 (PACS): FINDINGS: The heart size and mediastinal contours are within normal limits. Both lungs are clear. Atherosclerosis of thoracic aorta is noted. No pneumothorax is noted. Probable eventration is seen involving anterior portion of right hemidiaphragm. Left hemidiaphragm  is unremarkable. The visualized skeletal structures are unremarkable. IMPRESSION: - Probable eventration seen involving anterior portion of right hemidiaphragm. No acute abnormality seen in the chest. - Aortic Atherosclerosis (ICD10-I70.0).   Barium Swallow Study 08/19/18 (PACS): IMPRESSION: 1. Small to moderate sliding hiatal hernia with gastroesophageal reflux. 2. Mild LOWER esophageal mucosal ring, which does not obstruct the barium tablet. 3. Nonspecific esophageal motility disorder.    EKG:  EKG 03/11/19 11:37 AM (in ED): HR @ 127 bpm, occasional PVCs otherwise, significant artifact and baseline wanderer limit interpretation.  EKG 03/11/19 11:01 AM Sinus tachycardia with occasional Premature ventricular complexes. Rate 111 bpm. Left axis deviation Abnormal ECG   CV: N/A   Past Medical History:  Diagnosis Date  . Arthritis   . Complication of anesthesia    Pt has a problem with breathing and swallowing when laying flat on his back  . GERD (gastroesophageal reflux disease)   . HOH (hard of hearing)    Hx; of does not wear hearing aids  . Hypertension   .  Macular degeneration    Hx: of  . Psoriasis    Hx: of    Past Surgical History:  Procedure Laterality Date  . APPENDECTOMY    . CATARACT EXTRACTION W/ INTRAOCULAR LENS  IMPLANT, BILATERAL     Hx: of  . COLONOSCOPY W/ BIOPSIES AND POLYPECTOMY     Hx: of  . PAROTIDECTOMY Right 11/18/2012   Procedure: RIGHT PAROTIDECTOMY;  Surgeon: Melida Quitter, MD;  Location: [place];  Service: ENT;  Laterality: Right;  . RADICAL NECK DISSECTION Right 11/18/2012   Procedure: RIGHT RADICAL NECK DISSECTION/Right Selective Neck Dissection  ;  Surgeon: Melida Quitter, MD;  Location: [facility];  Service: ENT;  Laterality: Right;  Right Selective Neck Dissection  53mins  . TONSILLECTOMY      MEDICATIONS: . lisinopril (PRINIVIL,ZESTRIL) 20 MG tablet  . omeprazole (PRILOSEC) 20 MG capsule   No current facility-administered medications for  this encounter.    Myra Gianotti, PA-C Surgical Short Stay/Anesthesiology [facility] Phone [phone] [facility] Phone [phone] 03/11/2019 12:20 PM

## 2019-03-11 NOTE — ED Triage Notes (Signed)
Pt arrives from Preop with SOB. Pt states he has been SOB for months. Pt scheduled to have surgery on throat this coming week due to a mass on his vocal cords. Denies CP

## 2019-03-11 NOTE — Consult Note (Signed)
Radiation Oncology         [phone] ________________________________  Name: Ross Cameron        MRN: [id number]  Date of Service: 03/11/19 DOB: 1923/10/21  PT:Ross Cameron    REFERRING PHYSICIAN: Dr. Redmond Baseman  DIAGNOSIS: The encounter diagnosis was Mediastinal mass.   HISTORY OF PRESENT ILLNESS: Ross Cameron is a 86 y.o. male seen at the request of Dr. Redmond Baseman for a recently noted mass in the mediastinum. 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  The patient was being worked up for hoarseness, weight loss, and was found to have a left vocal cord paralysis. A CT neck as an outpatient on 02/26/19 revealed an irregular mass lesion in the left lateral trachea at the level of the aortic arch measuring 2 x 4 cm with extension into the superior mediastinum with possible extension into the proximal esophagus. He also had an area of enlargement int he left lower neck nodes lateral to the thyroid and left jugular vein. This measured about 14 mm. Apparently he has a prior history of a right parotid mass that was surgical removed as well without recurrent mass. He was being set up for esophagoscopy with biopsy today but in preop developed shortness of breath. He was tachycardic and taken to the ED. He had a CXR there that showed no acute findings. He was asked to be seen by critical care and their evaluation is pending. We're asked to consult for possible urgent radiotherapy. In speaking with his nurse in the ED he is resting comfortably following steroids and a nebulizer treatment, he had an O2 level of 100% RA, but is using 2L O2 via [place] for comfort.   PREVIOUS RADIATION THERAPY: No   PAST MEDICAL HISTORY:  Past Medical History:  Diagnosis Date  . Anemia   . Anxiety    not treated  . Arthritis   . Cancer ([place])    neck cancer, skin cancer - melanoma (ear)  . Complication of anesthesia    Pt  has a problem with breathing and swallowing when laying flat on his back  . Constipation    uses prune juice  . Depression   . GERD (gastroesophageal reflux disease)   . HOH (hard of hearing)    Hx; of does not wear hearing aids  . Hypertension   . Macular degeneration    Hx: of  . Memory deficits    per daughter  . Psoriasis    Hx: of       PAST SURGICAL HISTORY: Past Surgical History:  Procedure Laterality Date  . APPENDECTOMY    . CATARACT EXTRACTION W/ INTRAOCULAR LENS  IMPLANT, BILATERAL     Hx: of  . COLONOSCOPY W/ BIOPSIES AND POLYPECTOMY     Hx: of  . PAROTIDECTOMY Right 11/18/2012   Procedure: RIGHT PAROTIDECTOMY;  Surgeon: Melida Quitter, MD;  Location: [facility];  Service: ENT;  Laterality: Right;  . RADICAL NECK DISSECTION Right 11/18/2012   Procedure: RIGHT RADICAL NECK DISSECTION/Right Selective Neck Dissection  ;  Surgeon: Melida Quitter, MD;  Location: [place];  Service: ENT;  Laterality: Right;  Right Selective Neck Dissection  64mins  . TONSILLECTOMY       FAMILY HISTORY:  Family History  Problem Relation Age of Onset  . Cancer Other      SOCIAL HISTORY:  reports that he has quit smoking. His  smoking use included pipe and cigars. He has never used smokeless tobacco. He reports previous alcohol use. He reports that he does not use drugs. The patient is married and lives in [place], [place].    ALLERGIES: Aspirin   MEDICATIONS:  Current Facility-Administered Medications  Medication Dose Route Frequency Provider Last Rate Last Admin  . 0.9 %  sodium chloride infusion   Intravenous Continuous Smith, Rondell A, MD      . acetaminophen (TYLENOL) tablet 650 mg  650 mg Oral Q6H PRN Norval Morton, MD       Or  . acetaminophen (TYLENOL) suppository 650 mg  650 mg Rectal Q6H PRN Fuller Plan A, MD      . LORazepam (ATIVAN) injection 0.25 mg  0.25 mg Intravenous Q4H PRN Smith, Rondell A, MD      . ondansetron (ZOFRAN) tablet 4 mg  4 mg Oral Q6H PRN Fuller Plan A,  MD       Or  . ondansetron (ZOFRAN) injection 4 mg  4 mg Intravenous Q6H PRN Smith, Rondell A, MD      . Racepinephrine HCl 2.25 % nebulizer solution 0.25 mL  0.25 mL Nebulization Q2H PRN Norval Morton, MD       Current Outpatient Medications  Medication Sig Dispense Refill  . lisinopril (PRINIVIL,ZESTRIL) 20 MG tablet Take 20 mg by mouth daily.    Marland Kitchen omeprazole (PRILOSEC) 20 MG capsule Take 20 mg by mouth daily as needed (heartburn/indigestion).        REVIEW OF SYSTEMS: On review of systems, the patient is unable to speak very well due to hoarseness and his vocal cord paralysis. I was able to reach his wife and his daughter Ross Cameron. His daughter has been with him most the day in the ED, and is now at home but states that her dad has been dwindling over the past few weeks and months. He has had trouble with swallowing and weight loss, as well as increasing shortness of breath in the last few weeks. In our conversation, she states has been basically sleeping and existing in a recliner chair at home. He would like to have less shortness of breath, be able to eat, and go home and be comfortable. She states they have never talked about desires for code status as a family, and his wife confirms this. They do have a living will which his daughter Ross Cameron is trying to locate. She is also very concerned about his ability to lay flat and states that he had to be propped up with multiple pillows and cushions in January when he had his CT of the neck, and for this reason he declined further CT of the chest today. No other complaints are noted.      PHYSICAL EXAM:  Unable to assess due to encounter type.   ECOG = 3  0 - Asymptomatic (Fully active, able to carry on all predisease activities without restriction)  1 - Symptomatic but completely ambulatory (Restricted in physically strenuous activity but ambulatory and able to carry out work of a light or sedentary nature. For example, light housework,  office work)  2 - Symptomatic, <50% in bed during the day (Ambulatory and capable of all self care but unable to carry out any work activities. Up and about more than 50% of waking hours)  3 - Symptomatic, >50% in bed, but not bedbound (Capable of only limited self-care, confined to bed or chair 50% or more of waking hours)  4 - Bedbound (  Completely disabled. Cannot carry on any self-care. Totally confined to bed or chair)  5 - Death   Eustace Pen MM, Creech RH, Tormey DC, et al. [phone]). "Toxicity and response criteria of the [employer]". [place] Oncol. 5 (6): 649-55    LABORATORY DATA:  Lab Results  Component Value Date   WBC 8.0 03/11/2019   HGB 11.7 (L) 03/11/2019   HCT 35.9 (L) 03/11/2019   MCV 90.7 03/11/2019   PLT 257 03/11/2019   Lab Results  Component Value Date   NA 130 (L) 03/11/2019   K 4.9 03/11/2019   CL 95 (L) 03/11/2019   CO2 25 03/11/2019   Lab Results  Component Value Date   ALT 18 03/11/2019   AST 21 03/11/2019   ALKPHOS 80 03/11/2019   BILITOT 0.8 03/11/2019      RADIOGRAPHY: CT SOFT TISSUE NECK W CONTRAST  Addendum Date: 02/26/2019   ADDENDUM REPORT: 02/26/2019 16:28 ADDENDUM: These results were called by telephone at the time of interpretation on 02/26/2019 at 4:27 pm to provider DWIGHT BATES , who verbally acknowledged these results. Also noted is a 16 mm pre carina lymph node. Electronically Signed   By: Franchot Gallo M.D.   On: 02/26/2019 16:28   Result Date: 02/26/2019 CLINICAL DATA:  Paralysis left vocal cord. EXAM: CT NECK WITH CONTRAST TECHNIQUE: Multidetector CT imaging of the neck was performed using the standard protocol following the bolus administration of intravenous contrast. CONTRAST:  21mL ISOVUE-300 IOPAMIDOL (ISOVUE-300) INJECTION 61% Creatinine was obtained on site at [facility] at [address] Results: Creatinine 0.7 mg/dL. COMPARISON:  CT neck 11/05/2012 FINDINGS: Pharynx and larynx: Paralysis  left vocal cord with medial deviation and enlargement of the left piriform sinus. No laryngeal mass. Airway intact. Normal pharynx. Salivary glands: Right parotidectomy. No recurrent mass lesion. Left parotid normal. Submandibular gland normal bilaterally. Thyroid: Bilateral subcentimeter thyroid nodules Lymph nodes: Probable enlarged lymph node in the left lower neck lateral to the thyroid and lateral to the left jugular vein. This appears to be separate from the jugular vein and indenting the jugular vein. Anatomic detail obscured by patient motion. This measures approximately 14 mm in diameter and is not definitely seen on the prior study. This is just above the clavicle. Vascular: Carotid artery calcification bilaterally. Carotid and jugular patent bilaterally. Limited intracranial: Negative Visualized orbits: Not imaged Mastoids and visualized paranasal sinuses: Clear Skeleton: Cervical spondylosis. No acute skeletal lesion. Upper chest: Irregular mass lesion in the left lateral trachea at the level the aortic arch. This measures approximately 2 x 4 cm. Extension into the superior mediastinum with possible extension into the proximal esophagus. Other: None IMPRESSION: Paralysis left vocal cord. Bulky mass left lateral trachea extending into the mediastinum and possibly into the esophagus. This is consistent with carcinoma. 14 mm lymph node in the left lower neck lateral to the thyroid and jugular vein which could be malignant adenopathy. Electronically Signed: By: Franchot Gallo M.D. On: 02/26/2019 16:16   DG Chest Port 1 View  Result Date: 03/11/2019 CLINICAL DATA:  Shortness of breath for a few months. Preoperative examination. EXAM: PORTABLE CHEST 1 VIEW COMPARISON:  PA and lateral chest 09/27/2018. FINDINGS: Lungs are clear. Heart size is normal. No pneumothorax or pleural fluid. Aortic atherosclerosis is noted. No acute or focal bony abnormality. IMPRESSION: No acute disease. Atherosclerosis.  Electronically Signed   By: Inge Rise M.D.   On: 03/11/2019 11:59       IMPRESSION/PLAN: 1.  Probable advanced malignancy with obstructive mediastinal mass possibly eroding into the esophagus extending to the trachea, and cervical adenopathy. The patient's wife is holding out with the hopes that this is not cancerous, rather on my call today with their daughter Ross Cameron, she acknowledges that the process is most probably malignant. Ross Cameron would like her father to be kept comfortable so he can soon return home. I let her know that Dr. Lisbeth Renshaw has reviewed her father's case and would recommend continued work up including biopsy for tissue confirmation prior to offering radiotherapy. His chest was incompletely assessed on his prior CT, so it would be helpful to rule out any more distal lung disease. I ordered a dedicated CT of the chest but he refused due to inability to lay flat. We will follow up with pulmonary's recommendations and decision regarding bronchoscopy which is scheduled tomorrow. I'm concerned though in his ability to move forward with radiotherapy if he cannot comfortably lay flat for therapy.  2. Code Status and Goals of Care. In our discussion today, the patient's wife and daughter have not had conversations with the patient about his goals for care knowing that he's being worked up for malignancy, nor have they discussed goals of care regarding code status. They are hesitant to make any declarations regarding this, but his daughter does recognize that if he were to have a cardiopulmonary arrest, he would not likely recover from the event to a point where he'd return to his current state. We discussed options of having a meeting with palliative care and his daughter Ross Cameron is very much in favor of this discussion. I'll place an inpt referral for their assistance and appreciate their input.  In a visit lasting 90 minutes, greater than 50% of the time was spent by phone with the  patient's family, and in floor time coordinating the patient's care.       Carola Rhine, PAC

## 2019-03-11 NOTE — Telephone Encounter (Signed)
I called Dr Redmond Baseman. He spoke w Dr Tamala Julian at the hospital for a pulm consultation

## 2019-03-11 NOTE — Consult Note (Signed)
NAME:  Ross Cameron, MRN:  [id number], DOB:  May 12, 1923, LOS: 0 ADMISSION DATE:  03/11/2019, CONSULTATION DATE:  03/11/19 REFERRING MD:  Redmond Baseman, CHIEF COMPLAINT:  Hoarseness   Brief History   86 year old man with hx of melanoma of ear, R parotid squamous cell cancer, HTN presenting with hoarseness of breath sound during course of workup to have tracheal mass.  History of present illness   86 year old man with hx of melanoma of ear, R parotid squamous cell cancer, HTN presenting with hoarseness of breath sound during course of workup to have tracheal mass and left vocal cord paralysis.  Having some trouble with laying flat and mild hemoptysis.  Pulmonology consulted for bronchoscopy to get tissue diagnosis prior to palliative radiation.  Past Medical History   Past Medical History:  Diagnosis Date  . Anemia   . Anxiety    not treated  . Arthritis   . Cancer ([place])    neck cancer, skin cancer - melanoma (ear)  . Complication of anesthesia    Pt has a problem with breathing and swallowing when laying flat on his back  . Constipation    uses prune juice  . Depression   . GERD (gastroesophageal reflux disease)   . HOH (hard of hearing)    Hx; of does not wear hearing aids  . Hypertension   . Macular degeneration    Hx: of  . Memory deficits    per daughter  . Psoriasis    Hx: of     Significant Hospital Events   03/11/19 admitted  Consults:  ENT, PCCM  Procedures:  N/A  Significant Diagnostic Tests:  CT Head/Neck IMPRESSION: Paralysis left vocal cord.  Bulky mass left lateral trachea extending into the mediastinum and possibly into the esophagus. This is consistent with carcinoma. 14 mm lymph node in the left lower neck lateral to the thyroid and jugular vein which could be malignant adenopathy.  Micro Data:  N/A  Antimicrobials:  N/A   Interim history/subjective:  Consulted  Objective   Blood pressure (!) 146/89, pulse 100, temperature 98.1 F (36.7 C),  temperature source Oral, resp. rate 17, SpO2 100 %.       No intake or output data in the 24 hours ending 03/11/19 1806 There were no vitals filed for this visit.  Examination: General: elderly man in NAD HENT: MM dry, Trachea midline (visible) Lungs: Focal wheezing over larynx, otherwise lungs clear Cardiovascular: RRR, ext warm Abdomen: Soft, +BS Extremities: Muscle wasting Neuro: Moves all 4 ext to command, hoarse voice, hard of hearing Skin: slightly pale  Resolved Hospital Problem list   N/A  Assessment & Plan:  # Recurrent laryngeal nerve paralysis from tracheal mass # Hx squamous cell cancer parotid # Hx of melanoma  - NPO MN, bronch tomorrow to get tissue diagnosis - Patient has trouble lying flat but tracheal mass is nonobstructing, suspect related to vocal cord paralysis - We discussed just letting this be vs. biopsy and radiation, it is really causing him a lot of distress so he wants to proceed, I tried to call his daughter but no answer, will try again in AM  Labs   CBC: Recent Labs  Lab 03/11/19 1044  WBC 8.0  HGB 11.7*  HCT 35.9*  MCV 90.7  PLT 283    Basic Metabolic Panel: Recent Labs  Lab 03/11/19 1044  NA 130*  K 4.9  CL 95*  CO2 25  GLUCOSE 108*  BUN 7*  CREATININE 0.73  CALCIUM 9.5   GFR: Estimated Creatinine Clearance: 50.7 mL/min (by C-G formula based on SCr of 0.73 mg/dL). Recent Labs  Lab 03/11/19 1044  WBC 8.0    Liver Function Tests: Recent Labs  Lab 03/11/19 1044  AST 21  ALT 18  ALKPHOS 80  BILITOT 0.8  PROT 6.4*  ALBUMIN 3.4*   No results for input(s): LIPASE, AMYLASE in the last 168 hours. No results for input(s): AMMONIA in the last 168 hours.  ABG No results found for: PHART, PCO2ART, PO2ART, HCO3, TCO2, ACIDBASEDEF, O2SAT   Coagulation Profile: No results for input(s): INR, PROTIME in the last 168 hours.  Cardiac Enzymes: No results for input(s): CKTOTAL, CKMB, CKMBINDEX, TROPONINI in the last 168  hours.  HbA1C: No results found for: HGBA1C  CBG: No results for input(s): GLUCAP in the last 168 hours.  Review of Systems:    Positive Symptoms in bold:  Constitutional fevers, chills, weight loss, fatigue, anorexia, malaise  Eyes decreased vision, double vision, eye irritation  Ears, Nose, Mouth, Throat sore throat, trouble swallowing, sinus congestion  Cardiovascular chest pain, paroxysmal nocturnal dyspnea, lower ext edema, palpitations   Respiratory SOB, cough, DOE, hemoptysis, wheezing  Gastrointestinal nausea, vomiting, diarrhea  Genitourinary burning with urination, trouble urinating  Musculoskeletal joint aches, joint swelling, back pain  Integumentary  rashes, skin lesions  Neurological focal weakness, focal numbness, trouble speaking, headaches  Psychiatric depression, anxiety, confusion  Endocrine polyuria, polydipsia, cold intolerance, heat intolerance  Hematologic abnormal bruising, abnormal bleeding, unexplained nose bleeds  Allergic/Immunologic recurrent infections, hives, swollen lymph nodes     Past Medical History  He,  has a past medical history of Anemia, Anxiety, Arthritis, Cancer ([place]), Complication of anesthesia, Constipation, Depression, GERD (gastroesophageal reflux disease), HOH (hard of hearing), Hypertension, Macular degeneration, Memory deficits, and Psoriasis.   Surgical History    Past Surgical History:  Procedure Laterality Date  . APPENDECTOMY    . CATARACT EXTRACTION W/ INTRAOCULAR LENS  IMPLANT, BILATERAL     Hx: of  . COLONOSCOPY W/ BIOPSIES AND POLYPECTOMY     Hx: of  . PAROTIDECTOMY Right 11/18/2012   Procedure: RIGHT PAROTIDECTOMY;  Surgeon: Melida Quitter, MD;  Location: [facility];  Service: ENT;  Laterality: Right;  . RADICAL NECK DISSECTION Right 11/18/2012   Procedure: RIGHT RADICAL NECK DISSECTION/Right Selective Neck Dissection  ;  Surgeon: Melida Quitter, MD;  Location: [facility];  Service: ENT;  Laterality: Right;  Right Selective Neck  Dissection  41mins  . TONSILLECTOMY       Social History   reports that he has quit smoking. His smoking use included pipe and cigars. He has never used smokeless tobacco. He reports previous alcohol use. He reports that he does not use drugs.   Family History   His family history includes Cancer in an other family member.   Allergies Allergies  Allergen Reactions  . Aspirin     Causes bleeding     Home Medications  Prior to Admission medications   Medication Sig Start Date End Date Taking? Authorizing Provider  lisinopril (PRINIVIL,ZESTRIL) 20 MG tablet Take 20 mg by mouth daily.   Yes [provider]  omeprazole (PRILOSEC) 20 MG capsule Take 20 mg by mouth daily as needed (heartburn/indigestion).    Yes [provider]

## 2019-03-11 NOTE — Telephone Encounter (Signed)
Message routed to Dr. Lamonte Sakai per Ria Comment.

## 2019-03-11 NOTE — Progress Notes (Addendum)
PCP - Dr. Marlow Baars Cardiologist - denies  Chest x-ray - N/A EKG - today Stress Test - denies ECHO - denies Cardiac Cath - denies  ERAS Protcol - No  COVID TEST- to be done today.  Pt is extremely hard of hearing. No hearing aids.    Anesthesia review: yes, called Ebony Hail, PA to come see pt during PAT appointment due to pt wheezing, constantly trying to clear his throat, coughing. Pt and daughter both state that pt has been doing this for over a month. Daughter states pt had Bronchitis and it was much worse then. Pt is raspy when he speaks, audible wheezing noted. Daughter states that she has noticed the wheezing also. Pt one time coughed up white phlegm. Denies any blood being coughed up. Pt repeatedly told me that he CANNOT lie flat. He is sleeping in his recliner because if he lays flat, he starts choking.  Patient denies shortness of breath, fever, cough and chest pain at PAT appointment   All instructions explained to the patient, with a verbal understanding of the material. Patient agrees to go over the instructions while at home for a better understanding. Patient also instructed to self quarantine after being tested for COVID-19. The opportunity to ask questions was provided.  Pt was evaluated by Ebony Hail and Dr. Lissa Hoard, Anesthesiologist. After speaking with Dr. Redmond Baseman (pt's surgeon), it was decided to send pt to the ED for further evaluation. Daughter had stated that she feels more comfortable knowing that he was going to the ED.

## 2019-03-12 ENCOUNTER — Encounter (HOSPITAL_COMMUNITY): Payer: Self-pay | Admitting: Internal Medicine

## 2019-03-12 ENCOUNTER — Inpatient Hospital Stay (HOSPITAL_COMMUNITY): Payer: Medicare Other | Admitting: Anesthesiology

## 2019-03-12 ENCOUNTER — Encounter (HOSPITAL_COMMUNITY)
Admission: EM | Disposition: A | Discharge status: Home / Self Care | Payer: Self-pay | Source: Home / Self Care | Attending: Internal Medicine

## 2019-03-12 HISTORY — PX: ENDOBRONCHIAL ULTRASOUND: SHX5096

## 2019-03-12 HISTORY — PX: VIDEO BRONCHOSCOPY: SHX5072

## 2019-03-12 LAB — CBC
HCT: 31.2 % — ABNORMAL LOW (ref 39.0–52.0)
Hemoglobin: 10.5 g/dL — ABNORMAL LOW (ref 13.0–17.0)
MCH: 29.5 pg (ref 26.0–34.0)
MCHC: 33.7 g/dL (ref 30.0–36.0)
MCV: 87.6 fL (ref 80.0–100.0)
Platelets: 205 10*3/uL (ref 150–400)
RBC: 3.56 MIL/uL — ABNORMAL LOW (ref 4.22–5.81)
RDW: 12.8 % (ref 11.5–15.5)
WBC: 4.3 10*3/uL (ref 4.0–10.5)
nRBC: 0 % (ref 0.0–0.2)

## 2019-03-12 LAB — TYPE AND SCREEN
ABO/RH(D): O POS
Antibody Screen: NEGATIVE

## 2019-03-12 LAB — BASIC METABOLIC PANEL
Anion gap: 11 (ref 5–15)
BUN: 11 mg/dL (ref 8–23)
CO2: 23 mmol/L (ref 22–32)
Calcium: 8.8 mg/dL — ABNORMAL LOW (ref 8.9–10.3)
Chloride: 98 mmol/L (ref 98–111)
Creatinine, Ser: 0.6 mg/dL — ABNORMAL LOW (ref 0.61–1.24)
GFR calc Af Amer: >60 mL/min (ref >60)
GFR calc non Af Amer: >60 mL/min (ref >60)
Glucose, Bld: 231 mg/dL — ABNORMAL HIGH (ref 70–99)
Potassium: 4.1 mmol/L (ref 3.5–5.1)
Sodium: 132 mmol/L — ABNORMAL LOW (ref 135–145)

## 2019-03-12 LAB — ABO/RH: ABO/RH(D): O POS

## 2019-03-12 SURGERY — BRONCHOSCOPY, WITH FLUOROSCOPY
Anesthesia: General

## 2019-03-12 MED ORDER — EPINEPHRINE PF 1 MG/ML IJ SOLN
INTRAMUSCULAR | Status: DC | PRN
  Administered 2019-03-12: 1 mg

## 2019-03-12 MED ORDER — SUCCINYLCHOLINE CHLORIDE 200 MG/10ML IV SOSY
PREFILLED_SYRINGE | INTRAVENOUS | Status: AC
  Filled 2019-03-12: qty 10

## 2019-03-12 MED ORDER — LIDOCAINE 2% (20 MG/ML) 5 ML SYRINGE
INTRAMUSCULAR | Status: DC | PRN
  Administered 2019-03-12: 60 mg via INTRAVENOUS

## 2019-03-12 MED ORDER — 0.9 % SODIUM CHLORIDE (POUR BTL) OPTIME
TOPICAL | Status: DC | PRN
  Administered 2019-03-12: 1000 mL

## 2019-03-12 MED ORDER — LIDOCAINE 2% (20 MG/ML) 5 ML SYRINGE
INTRAMUSCULAR | Status: AC
  Filled 2019-03-12: qty 5

## 2019-03-12 MED ORDER — FENTANYL CITRATE (PF) 100 MCG/2ML IJ SOLN: 25.0000 ug | INTRAMUSCULAR | Status: DC | PRN

## 2019-03-12 MED ORDER — SUCCINYLCHOLINE CHLORIDE 200 MG/10ML IV SOSY
PREFILLED_SYRINGE | INTRAVENOUS | Status: DC | PRN
  Administered 2019-03-12: 40 mg via INTRAVENOUS

## 2019-03-12 MED ORDER — PHENYLEPHRINE 40 MCG/ML (10ML) SYRINGE FOR IV PUSH (FOR BLOOD PRESSURE SUPPORT)
PREFILLED_SYRINGE | INTRAVENOUS | Status: AC
  Filled 2019-03-12: qty 10

## 2019-03-12 MED ORDER — PROPOFOL 10 MG/ML IV BOLUS
INTRAVENOUS | Status: DC | PRN
  Administered 2019-03-12: 80 mg via INTRAVENOUS

## 2019-03-12 MED ORDER — FENTANYL CITRATE (PF) 250 MCG/5ML IJ SOLN
INTRAMUSCULAR | Status: AC
  Filled 2019-03-12: qty 5

## 2019-03-12 MED ORDER — ONDANSETRON HCL 4 MG/2ML IJ SOLN
INTRAMUSCULAR | Status: DC | PRN
  Administered 2019-03-12: 4 mg via INTRAVENOUS

## 2019-03-12 MED ORDER — LACTATED RINGERS IV SOLN: INTRAVENOUS | Status: DC

## 2019-03-12 MED ORDER — ONDANSETRON HCL 4 MG/2ML IJ SOLN
INTRAMUSCULAR | Status: AC
  Filled 2019-03-12: qty 2

## 2019-03-12 SURGICAL SUPPLY — 38 items
BALL CTTN LRG ABS STRL LF (GAUZE/BANDAGES/DRESSINGS)
BLOCK BITE 60FR ADLT L/F BLUE (MISCELLANEOUS) IMPLANT
BRUSH CYTOL CELLEBRITY 1.5X140 (MISCELLANEOUS) IMPLANT
CANISTER SUCT 3000ML PPV (MISCELLANEOUS) ×3 IMPLANT
CNTNR URN SCR LID CUP LEK RST (MISCELLANEOUS) ×2 IMPLANT
CONT SPEC 4OZ STRL OR WHT (MISCELLANEOUS) ×6
COTTONBALL LRG STERILE PKG (GAUZE/BANDAGES/DRESSINGS) IMPLANT
COVER BACK TABLE 60X90IN (DRAPES) ×3 IMPLANT
COVER WAND RF STERILE (DRAPES) ×1 IMPLANT
FILTER STRAW FLUID ASPIR (MISCELLANEOUS) ×2 IMPLANT
FORCEPS BIOP RJ4 1.8 (CUTTING FORCEPS) IMPLANT
GAUZE SPONGE 4X4 12PLY STRL (GAUZE/BANDAGES/DRESSINGS) ×3 IMPLANT
GLOVE BIO SURGEON STRL SZ7.5 (GLOVE) ×3 IMPLANT
GLOVE BIOGEL PI IND STRL 6.5 (GLOVE) IMPLANT
GLOVE BIOGEL PI INDICATOR 6.5 (GLOVE) ×2
GOWN STRL REUS W/ TWL LRG LVL3 (GOWN DISPOSABLE) IMPLANT
GOWN STRL REUS W/ TWL XL LVL3 (GOWN DISPOSABLE) IMPLANT
GOWN STRL REUS W/TWL LRG LVL3 (GOWN DISPOSABLE) ×6
GOWN STRL REUS W/TWL XL LVL3 (GOWN DISPOSABLE) ×3
KIT CLEAN ENDO COMPLIANCE (KITS) ×5 IMPLANT
KIT TURNOVER KIT B (KITS) ×3 IMPLANT
NDL ASPIRATION VIZISHOT 21G (NEEDLE) IMPLANT
NEEDLE 22X1 1/2 (OR ONLY) (NEEDLE) ×2 IMPLANT
NEEDLE ASPIRATION VIZISHOT 21G (NEEDLE) ×3 IMPLANT
NS IRRIG 1000ML POUR BTL (IV SOLUTION) ×3 IMPLANT
OIL SILICONE PENTAX (PARTS (SERVICE/REPAIRS)) ×3 IMPLANT
PAD ARMBOARD 7.5X6 YLW CONV (MISCELLANEOUS) ×6 IMPLANT
SYR 20ML ECCENTRIC (SYRINGE) ×5 IMPLANT
SYR 5ML LL (SYRINGE) ×1 IMPLANT
SYR 5ML LUER SLIP (SYRINGE) ×1 IMPLANT
SYR CONTROL 10ML LL (SYRINGE) IMPLANT
TOWEL GREEN STERILE FF (TOWEL DISPOSABLE) ×3 IMPLANT
TRAP SPECIMEN MUCOUS 40CC (MISCELLANEOUS) ×3 IMPLANT
TUBE CONNECTING 20'X1/4 (TUBING) ×1
TUBE CONNECTING 20X1/4 (TUBING) ×2 IMPLANT
VALVE BIOPSY  SINGLE USE (MISCELLANEOUS) ×2
VALVE BIOPSY SINGLE USE (MISCELLANEOUS) IMPLANT
VALVE SUCTION BRONCHIO DISP (MISCELLANEOUS) ×4 IMPLANT

## 2019-03-12 NOTE — Progress Notes (Signed)
Palliative consult received.   Chart reviewed- spoke with both of patient's daughters to arrange meeting. Meeting scheduled for Friday 2/5 at 11am per daughter's request.   Mariana Kaufman, AGNP-C Palliative Medicine  Please call Palliative Medicine team phone with any questions [phone]. For individual providers please see AMION.  No charge

## 2019-03-12 NOTE — Progress Notes (Signed)
I attempted to discuss the patient's situation with his daughter Peter Congo who is at the bedside. At first she was agreeable to talk with me but felt overwhelmed by all that was going on and decided we needed to end the call. She requested that we continue the conversation at a later time. I will try to call later after clinic to have a discussion with both daughters.     Carola Rhine, PAC

## 2019-03-12 NOTE — Progress Notes (Signed)
PROGRESS NOTE  MONTRAY KLIEBERT KVQ:[id number] DOB: 04-Oct-1923 DOA: 03/11/2019 PCP: Cyndi Bender, [facility] Course/Subjective: Ross Cameron is a 86 y.o. 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 Admitted prior to biopsy of left tracheal mass due to SOB and consulted ENT, PCCM, radiation oncology. Rad onc would like to await pathology before initiation radiation, and he is scheduled for bronchoscopy with biopsy today. Palliative care has also been consulted.  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  CT scan of the neck from 1/20 \ noted the left lateral tracheal mass extending into the mediastinum and possibly into the esophagus.  Dr. Redmond Baseman of ENT consulted and consulted PCCM for possible biopsy biopsy and radiation oncology for possible palliative radiation -Admit to a progressive bed -Aspiration precautions -Elevate head of bed -Racemic epinephrine as needed -Decadron 10 mg IV 3 times daily, adjust dose per oncology recommendation -Follow-up CT scan of the chest when able to be obtained -Appreciate ENT, PCCM, and radiation oncology -Consult palliative care for management in the outpatient setting  Dysphagia: Secondary to tracheal mass. -Speech therapy to evaluate and give recommendations  Essential hypertension: Patient normally on lisinopril 20 mg daily. -Consider restarting when medically appropriate, mildly hypotensive this AM  Normocytic anemia: Hemoglobin 10.5 today.  Patient has been coughing up blood-tinged sputum, but no gross hemoptysis appreciated -Continue to monitor  Hyponatremia: Sodium 130 on admission.  Suspect that this could be secondary to malignancy and/or dehydration. -Normal saline at 75 mL/h overnight -Recheck sodium  levels up to 132  Anxiety: Patient not on anything for anxiety at home. -Ativan IV as needed  DVT prophylaxis: SCD  Code Status: Full for now.  Family wants to discuss this more.  Family Communication: None present this AM patient awake and oriented  Disposition Plan: To be determined Consults called: ENT, PCCM, radiation oncology, pall care Admission status: Inpatient   Objective: Vitals:   03/11/19 1615 03/11/19 1659 03/11/19 1915 03/12/19 0305  BP: 139/73 (!) 146/89  (!) 98/55  Pulse: 99 100  100  Resp: 17   (!) 27  Temp:   97.9 F (36.6 C) 97.9 F (36.6 C)  TempSrc:   Oral Oral  SpO2: 100% 100%  100%   No intake or output data in the 24 hours ending 03/12/19 0729 There were no vitals filed for this visit.   Exam: General:  Alert, oriented, calm, in no acute distress, sitting upright on 2L O2 Eyes: EOMI, clear sclerea Neck: supple, nontender Cardiovascular: RRR, no murmurs or rubs, no peripheral edema  Respiratory: clear to auscultation bilaterally, no wheezes, no crackles, upper airway wetness Abdomen: soft, nontender, nondistended, normal bowel tones heard  Skin: dry, no rashes  Musculoskeletal: no joint effusions, normal range of motion  Psychiatric: appropriate affect, normal speech  Neurologic: extraocular muscles intact, clear speech, moving all extremities with intact sensorium    Data Reviewed: CBC: Recent Labs  Lab 03/11/19 1044 03/12/19 0210  WBC 8.0 4.3  HGB 11.7* 10.5*  HCT 35.9* 31.2*  MCV 90.7 87.6  PLT 257 643   Basic Metabolic Panel: Recent Labs  Lab 03/11/19 1044 03/12/19 0210  NA 130* 132*  K 4.9 4.1  CL 95* 98  CO2 25 23  GLUCOSE 108* 231*  BUN 7* 11  CREATININE 0.73 0.60*  CALCIUM 9.5 8.8*  GFR: Estimated Creatinine Clearance: 50.7 mL/min (A) (by C-G formula based on SCr of 0.6 mg/dL (L)). Liver Function Tests: Recent Labs  Lab 03/11/19 1044  AST 21  ALT 18  ALKPHOS 80  BILITOT 0.8  PROT 6.4*  ALBUMIN 3.4*    No results for input(s): LIPASE, AMYLASE in the last 168 hours. No results for input(s): AMMONIA in the last 168 hours. Coagulation Profile: No results for input(s): INR, PROTIME in the last 168 hours. Cardiac Enzymes: No results for input(s): CKTOTAL, CKMB, CKMBINDEX, TROPONINI in the last 168 hours. BNP (last 3 results) No results for input(s): PROBNP in the last 8760 hours. HbA1C: No results for input(s): HGBA1C in the last 72 hours. CBG: No results for input(s): GLUCAP in the last 168 hours. Lipid Profile: No results for input(s): CHOL, HDL, LDLCALC, TRIG, CHOLHDL, LDLDIRECT in the last 72 hours. Thyroid Function Tests: No results for input(s): TSH, T4TOTAL, FREET4, T3FREE, THYROIDAB in the last 72 hours. Anemia Panel: No results for input(s): VITAMINB12, FOLATE, FERRITIN, TIBC, IRON, RETICCTPCT in the last 72 hours. Urine analysis: No results found for: COLORURINE, APPEARANCEUR, LABSPEC, PHURINE, GLUCOSEU, HGBUR, BILIRUBINUR, KETONESUR, PROTEINUR, UROBILINOGEN, NITRITE, LEUKOCYTESUR Sepsis Labs: @LABRCNTIP (procalcitonin:4,lacticidven:4)  ) Recent Results (from the past 240 hour(s))  Respiratory Panel by RT PCR (Flu A&B, Covid) - Nasopharyngeal Swab     Status: None   Collection Time: 03/11/19 12:10 PM   Specimen: Nasopharyngeal Swab  Result Value Ref Range Status   SARS Coronavirus 2 by RT PCR NEGATIVE NEGATIVE Final    Comment: (NOTE) SARS-CoV-2 target nucleic acids are NOT DETECTED. The SARS-CoV-2 RNA is generally detectable in upper respiratoy specimens during the acute phase of infection. The lowest concentration of SARS-CoV-2 viral copies this assay can detect is 131 copies/mL. A negative result does not preclude SARS-Cov-2 infection and should not be used as the sole basis for treatment or other patient management decisions. A negative result may occur with  improper specimen collection/handling, submission of specimen other than nasopharyngeal swab, presence of  viral mutation(s) within the areas targeted by this assay, and inadequate number of viral copies (<131 copies/mL). A negative result must be combined with clinical observations, patient history, and epidemiological information. The expected result is Negative. Fact Sheet for Patients:  PinkCheek.be Fact Sheet for Healthcare Providers:  GravelBags.it This test is not yet ap proved or cleared by the [place] FDA and  has been authorized for detection and/or diagnosis of SARS-CoV-2 by FDA under an Emergency Use Authorization (EUA). This EUA will remain  in effect (meaning this test can be used) for the duration of the COVID-19 declaration under Section 564(b)(1) of the Act, 21 U.S.C. section 360bbb-3(b)(1), unless the authorization is terminated or revoked sooner.    Influenza A by PCR NEGATIVE NEGATIVE Final   Influenza B by PCR NEGATIVE NEGATIVE Final    Comment: (NOTE) The Xpert Xpress SARS-CoV-2/FLU/RSV assay is intended as an aid in  the diagnosis of influenza from Nasopharyngeal swab specimens and  should not be used as a sole basis for treatment. Nasal washings and  aspirates are unacceptable for Xpert Xpress SARS-CoV-2/FLU/RSV  testing. Fact Sheet for Patients: PinkCheek.be Fact Sheet for Healthcare Providers: GravelBags.it This test is not yet approved or cleared by the [place] FDA and  has been authorized for detection and/or diagnosis of SARS-CoV-2 by  FDA under an Emergency Use Authorization (EUA). This EUA will remain  in effect (meaning this test can be used) for the duration of the  Covid-19 declaration  under Section 564(b)(1) of the Act, 21  U.S.C. section 360bbb-3(b)(1), unless the authorization is  terminated or revoked. Performed at [facility], [place] [address], [place], [place] [address]      Studies: DG Chest Port 1  View  Result Date: 03/11/2019 CLINICAL DATA:  Shortness of breath for a few months. Preoperative examination. EXAM: PORTABLE CHEST 1 VIEW COMPARISON:  PA and lateral chest 09/27/2018. FINDINGS: Lungs are clear. Heart size is normal. No pneumothorax or pleural fluid. Aortic atherosclerosis is noted. No acute or focal bony abnormality. IMPRESSION: No acute disease. Atherosclerosis. Electronically Signed   By: Inge Rise M.D.   On: 03/11/2019 11:59    Scheduled Meds: . dexamethasone (DECADRON) injection  10 mg Intravenous TID  . pantoprazole  40 mg Oral Daily    Continuous Infusions: . sodium chloride 75 mL/hr at 03/11/19 1755     LOS: 1 day   Time spent: 21 minutes  Dayveon Halley Marry Guan, MD [facility] Pager [phone]  If 7PM-7AM, please contact night-coverage [email or web address] Password [facility] 03/12/2019, 7:29 AM

## 2019-03-12 NOTE — Progress Notes (Signed)
Pulm Note  S: A little confused this AM.  O:  Today's Vitals   03/12/19 0400 03/12/19 0800 03/12/19 0820 03/12/19 1317  BP:   (!) 119/58   Pulse:   (!) 52   Resp:   (!) 24   Temp:   97.9 F (36.6 C)   TempSrc:   Oral   SpO2:   100%   Weight:    63.5 kg  Height:    5\' 7"  (1.702 m)  PainSc: 0-No pain 0-No pain     Body mass index is 21.93 kg/m. Ill appearing man laying in bed Wheezing over larynx Slightly tachypneic No edema +muscle wasting  A: Tracheal mass with RLN involvement  P: After discussion with patient and both daughters, we are going to try to get a diagnosis then discuss risks/benefits of further therapy.  Obviously high risk for biopsy and anesthesia but alternative is just as grim.  Will follow with you  Erskine Emery MD

## 2019-03-12 NOTE — Anesthesia Preprocedure Evaluation (Addendum)
Anesthesia Evaluation  Patient identified by MRN, date of birth, ID band Patient awake    Reviewed: Allergy & Precautions, H&P , NPO status , Patient's Chart, lab work & pertinent test results  Airway Mallampati: II  TM Distance: >3 FB Neck ROM: Full    Dental no notable dental hx. (+) Lower Dentures, Partial Upper, Dental Advisory Given   Pulmonary neg pulmonary ROS, former smoker,  Tracheal mass   Pulmonary exam normal  + decreased breath sounds+ wheezing      Cardiovascular hypertension, Pt. on medications  Rhythm:Regular Rate:Normal     Neuro/Psych Anxiety Depression negative neurological ROS     GI/Hepatic Neg liver ROS, GERD  Medicated and Controlled,  Endo/Other  negative endocrine ROS  Renal/GU negative Renal ROS  negative genitourinary   Musculoskeletal  (+) Arthritis , Osteoarthritis,    Abdominal   Peds  Hematology  (+) Blood dyscrasia, anemia ,   Anesthesia Other Findings   Reproductive/Obstetrics negative OB ROS                            Anesthesia Physical Anesthesia Plan  ASA: IV  Anesthesia Plan: General   Post-op Pain Management:    Induction: Intravenous  PONV Risk Score and Plan: 2 and Ondansetron and Treatment may vary due to age or medical condition  Airway Management Planned: Oral ETT  Additional Equipment:   Intra-op Plan:   Post-operative Plan: Extubation in OR  Informed Consent: I have reviewed the patients History and Physical, chart, labs and discussed the procedure including the risks, benefits and alternatives for the proposed anesthesia with the patient or authorized representative who has indicated his/her understanding and acceptance.     Dental advisory given  Plan Discussed with: CRNA  Anesthesia Plan Comments: (This patient has a tracheal mass with erosion into the esophagus and mediastinum. He is 86 yrs old and having difficulty  breathing. I spoke with the family about the poor prognosis and likely needing to leave the patient intubated and the risk of bleeding. The family wishes to proceed.)       Anesthesia Quick Evaluation

## 2019-03-12 NOTE — Progress Notes (Signed)
I spoke with Marzetta Board the patient's nurse this morning. Unfortunately the patient is unable to engage in conversation with me today due to confusion. I'll reach out to Dr. Tamala Julian with pulmonary as well.     Carola Rhine, PAC

## 2019-03-12 NOTE — Transfer of Care (Signed)
Immediate Anesthesia Transfer of Care Note  Patient: Ross Cameron  Procedure(s) Performed: BRONCHOSCOPY WITH EBUS (N/A ) ENDOBRONCHIAL ULTRASOUND (N/A )  Patient Location: PACU  Anesthesia Type:General  Level of Consciousness: awake and patient cooperative  Airway & Oxygen Therapy: Patient Spontanous Breathing and Patient connected to face mask oxygen  Post-op Assessment: Report given to RN and Post -op Vital signs reviewed and stable  Post vital signs: Reviewed and stable  Last Vitals:  Vitals Value Taken Time  BP 131/71 03/12/19 1554  Temp    Pulse 95 03/12/19 1559  Resp 22 03/12/19 1559  SpO2 100 % 03/12/19 1559  Vitals shown include unvalidated device data.  Last Pain:  Vitals:   03/12/19 0820  TempSrc: Oral  PainSc:          Complications: No apparent anesthesia complications

## 2019-03-12 NOTE — Anesthesia Postprocedure Evaluation (Signed)
Anesthesia Post Note  Patient: TASHA JINDRA  Procedure(s) Performed: BRONCHOSCOPY WITH EBUS (N/A ) ENDOBRONCHIAL ULTRASOUND (N/A )     Patient location during evaluation: PACU Anesthesia Type: General Level of consciousness: awake and alert Pain management: pain level controlled Vital Signs Assessment: post-procedure vital signs reviewed and stable Respiratory status: spontaneous breathing, nonlabored ventilation, respiratory function stable and patient connected to nasal cannula oxygen Cardiovascular status: blood pressure returned to baseline and stable Postop Assessment: no apparent nausea or vomiting Anesthetic complications: no    Last Vitals:  Vitals:   03/12/19 1555 03/12/19 1615  BP: 131/71 (!) 120/54  Pulse: 94 88  Resp: 15 19  Temp: (!) 36.4 C   SpO2: 100% 100%    Last Pain:  Vitals:   03/12/19 1615  TempSrc:   PainSc: 0-No pain                 Amedee Cerrone,W. EDMOND

## 2019-03-12 NOTE — Progress Notes (Signed)
Pt NPO for broSLP Cancellation Note  Patient Details Name: Ross Cameron MRN: [id number] DOB: 06-02-1923   Cancelled treatment:        Pt NPO for bronchoscopy. Will continue efforts for bedside swallow eval    Houston Siren 03/12/2019, 9:14 AM Orbie Pyo Colvin Caroli.Ed Risk analyst [phone] Office [phone]

## 2019-03-12 NOTE — Progress Notes (Signed)
Called daughter on phone, explained situation, she is agreeable to biopsy, understands prognosis is pretty grim.  Erskine Emery MD

## 2019-03-12 NOTE — Anesthesia Procedure Notes (Signed)
Procedure Name: Intubation Date/Time: 03/12/2019 3:05 PM Performed by: Kathryne Hitch, CRNA Pre-anesthesia Checklist: Patient identified, Emergency Drugs available, Suction available and Patient being monitored Patient Re-evaluated:Patient Re-evaluated prior to induction Oxygen Delivery Method: Circle system utilized Preoxygenation: Pre-oxygenation with 100% oxygen Induction Type: IV induction Ventilation: Mask ventilation without difficulty Laryngoscope Size: Miller and 3 Grade View: Grade I Tube type: Oral Number of attempts: 1 Airway Equipment and Method: Stylet and Oral airway Placement Confirmation: ETT inserted through vocal cords under direct vision,  positive ETCO2 and breath sounds checked- equal and bilateral Secured at: 23 cm Tube secured with: Tape Dental Injury: Teeth and Oropharynx as per pre-operative assessment

## 2019-03-12 NOTE — Procedures (Signed)
Bronchoscopy  Indication: Tracheal mass  Consent: Signed and in chart  Anesthesia: General  Procedure - Timeout performed - Bronchoscope advanced through ETT - Airways examined down to subsegmental level - Following airway examination, EBUS-guided biopsy performed of endobronchial lesion  Findings - Large fungating partially obstructive tracheal mass about 5 cm above carina - Some bleeding post procedure controlled with 2 cc of 1:10000 epinephrine - Suctioned remaining clot out of left lower lobe, all airways subsequently clear  Specimen(s): tracheal mass  Complications: None

## 2019-03-13 ENCOUNTER — Other Ambulatory Visit: Payer: Self-pay

## 2019-03-13 DIAGNOSIS — R1310 Dysphagia, unspecified: Secondary | ICD-10-CM

## 2019-03-13 DIAGNOSIS — F19959 Other psychoactive substance use, unspecified with psychoactive substance-induced psychotic disorder, unspecified: Secondary | ICD-10-CM | POA: Diagnosis present

## 2019-03-13 DIAGNOSIS — R410 Disorientation, unspecified: Secondary | ICD-10-CM

## 2019-03-13 LAB — CBC
HCT: 32.8 % — ABNORMAL LOW (ref 39.0–52.0)
Hemoglobin: 10.9 g/dL — ABNORMAL LOW (ref 13.0–17.0)
MCH: 29.9 pg (ref 26.0–34.0)
MCHC: 33.2 g/dL (ref 30.0–36.0)
MCV: 89.9 fL (ref 80.0–100.0)
Platelets: 203 10*3/uL (ref 150–400)
RBC: 3.65 MIL/uL — ABNORMAL LOW (ref 4.22–5.81)
RDW: 13.2 % (ref 11.5–15.5)
WBC: 11.1 10*3/uL — ABNORMAL HIGH (ref 4.0–10.5)
nRBC: 0 % (ref 0.0–0.2)

## 2019-03-13 LAB — BASIC METABOLIC PANEL
Anion gap: 8 (ref 5–15)
BUN: 12 mg/dL (ref 8–23)
CO2: 21 mmol/L — ABNORMAL LOW (ref 22–32)
Calcium: 9 mg/dL (ref 8.9–10.3)
Chloride: 105 mmol/L (ref 98–111)
Creatinine, Ser: 0.66 mg/dL (ref 0.61–1.24)
GFR calc Af Amer: >60 mL/min (ref >60)
GFR calc non Af Amer: >60 mL/min (ref >60)
Glucose, Bld: 135 mg/dL — ABNORMAL HIGH (ref 70–99)
Potassium: 4.9 mmol/L (ref 3.5–5.1)
Sodium: 134 mmol/L — ABNORMAL LOW (ref 135–145)

## 2019-03-13 MED ORDER — QUETIAPINE FUMARATE 25 MG PO TABS
25.0000 mg | ORAL_TABLET | Freq: Two times a day (BID) | ORAL | Status: DC
  Administered 2019-03-13 – 2019-03-15 (×3): 25 mg via ORAL
  Filled 2019-03-13 (×4): qty 1

## 2019-03-13 MED ORDER — DEXAMETHASONE SODIUM PHOSPHATE 4 MG/ML IJ SOLN
2.0000 mg | Freq: Three times a day (TID) | INTRAMUSCULAR | Status: AC
  Administered 2019-03-13 – 2019-03-15 (×6): 2 mg via INTRAVENOUS
  Filled 2019-03-13 (×6): qty 1

## 2019-03-13 NOTE — Progress Notes (Signed)
Called [facility] and told them to cancel transport for tomorrow

## 2019-03-13 NOTE — Progress Notes (Signed)
I spoke with the nurse taking care of the patient this am. The patient is persistently confused and was unable to consent himself for his procedure yesterday. While initially the patient agreed to talk with me, he declined when we tried to actually engage by phone. I was not able to reach his daughters yesterday so I will try today.     Carola Rhine, PAC

## 2019-03-13 NOTE — Progress Notes (Signed)
Pulm Note  S: Agitated and angry he didn't get his blood pressure pill this AM. No hemoptysis.  O:  Today's Vitals   03/13/19 0000 03/13/19 0330 03/13/19 0400 03/13/19 0827  BP:    117/71  Pulse:    71  Resp:    17  Temp:  97.8 F (36.6 C)  98.6 F (37 C)  TempSrc:  Oral    SpO2:    100%  Weight:      Height:      PainSc: 0-No pain  0-No pain    Body mass index is 21.93 kg/m. Ill appearing man laying in bed Wheezing over larynx Less tachypneic Irritated, does not understand why he is in hospital No edema +muscle wasting  A: Tracheal mass with RLN involvement s/p biopsy Steroid-exacerbated delirium  P: - Start seroquel BID - Re orient as able - Drop dexamethasone to 2mg  TID x 2 more days then stop, doubt it's doing anything - Await path, further plans per radiation oncology - Will be available as needed   Erskine Emery MD

## 2019-03-13 NOTE — Progress Notes (Signed)
Spoke with the nurse to inform that there has been a change in plans and the patient's ct simulation would not take place on 03/14/2019.  Informed the nurse to call [facility] and cancel transportation.  Will follow as necessary.   Gloriajean Dell. Leonie Green, BSN

## 2019-03-13 NOTE — Progress Notes (Signed)
PROGRESS NOTE  Ross Cameron:[id number] DOB: August 15, 1923 DOA: 03/11/2019 PCP: Cyndi Bender, [facility] Course/Subjective: Ross Cameron is a 86 y.o. 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 Admitted prior to biopsy of left tracheal mass due to SOB and consulted ENT, PCCM, radiation oncology. Rad onc would like to await pathology before initiation radiation, and he is scheduled for bronchoscopy with biopsy today. Palliative care has also been consulted.  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  CT scan of the neck from 1/20 \ noted the left lateral tracheal mass extending into the mediastinum and possibly into the esophagus.  Dr. Redmond Baseman of ENT consulted and consulted PCCM for possible biopsy biopsy and radiation oncology for possible palliative radiation -Aspiration precautions -Elevate head of bed -Racemic epinephrine as needed -Decadron 10 mg IV 3 times daily, adjust dose per oncology recommendation -Follow-up CT scan of the chest when able to be obtained -Appreciate ENT, PCCM, and radiation oncology - s/p bronch with bx 2/3 -palliative care family meeting 2/5  Dysphagia: Secondary to tracheal mass. -Speech therapy to evaluate and give recommendations  Essential hypertension: Patient normally on lisinopril 20 mg daily. -Consider restarting when medically appropriate, currently normotensive  Normocytic anemia: Hemoglobin stable today.  Patient has been coughing up blood-tinged sputum, but no gross hemoptysis appreciated -Continue to monitor  Hyponatremia: Sodium 130 on admission.  Suspect that this could be secondary to malignancy and/or dehydration. -Normal saline at 75 mL/h overnight -Recheck sodium levels coming up  Anxiety:  Patient not on anything for anxiety at home. -Ativan IV as needed  DVT prophylaxis: SCD  Code Status: Full for now.  Family wants to discuss this more, meeting on 2/5.  Family Communication: None present this AM patient awake and oriented  Disposition Plan: To be determined Consults called: ENT, PCCM, radiation oncology, pall care Admission status: Inpatient   Objective: Vitals:   03/12/19 1925 03/12/19 2303 03/13/19 0330 03/13/19 0827  BP:  108/65  117/71  Pulse:    71  Resp:  19  17  Temp: 97.9 F (36.6 C) 97.9 F (36.6 C) 97.8 F (36.6 C) 98.6 F (37 C)  TempSrc: Oral Oral Oral   SpO2:    100%  Weight:      Height:        Intake/Output Summary (Last 24 hours) at 03/13/2019 0841 Last data filed at 03/12/2019 1534 Gross per 24 hour  Intake 700 ml  Output 750 ml  Net -50 ml   Filed Weights   03/12/19 1317  Weight: 63.5 kg     Exam: General:  Alert, oriented, calm, in no acute distress, sitting upright on 2L O2 Eyes: EOMI, clear sclerea Neck: supple, nontender Cardiovascular: RRR, no murmurs or rubs, no peripheral edema  Respiratory: clear to auscultation bilaterally, no wheezes, no crackles, upper airway wetness Abdomen: soft, nontender, nondistended, normal bowel tones heard  Skin: dry, no rashes  Musculoskeletal: no joint effusions, normal range of motion  Psychiatric: appropriate affect, normal speech  Neurologic: extraocular muscles intact, clear speech, moving all extremities with intact sensorium    Data Reviewed: CBC: Recent Labs  Lab 03/11/19 1044 03/12/19 0210 03/13/19 0329  WBC 8.0 4.3 11.1*  HGB 11.7* 10.5* 10.9*  HCT 35.9* 31.2* 32.8*  MCV 90.7 87.6 89.9  PLT 257 205 203   Basic  Metabolic Panel: Recent Labs  Lab 03/11/19 1044 03/12/19 0210 03/13/19 0329  NA 130* 132* 134*  K 4.9 4.1 4.9  CL 95* 98 105  CO2 25 23 21*  GLUCOSE 108* 231* 135*  BUN 7* 11 12  CREATININE 0.73 0.60* 0.66  CALCIUM 9.5 8.8* 9.0   GFR: Estimated  Creatinine Clearance: 50.7 mL/min (by C-G formula based on SCr of 0.66 mg/dL). Liver Function Tests: Recent Labs  Lab 03/11/19 1044  AST 21  ALT 18  ALKPHOS 80  BILITOT 0.8  PROT 6.4*  ALBUMIN 3.4*   No results for input(s): LIPASE, AMYLASE in the last 168 hours. No results for input(s): AMMONIA in the last 168 hours. Coagulation Profile: No results for input(s): INR, PROTIME in the last 168 hours. Cardiac Enzymes: No results for input(s): CKTOTAL, CKMB, CKMBINDEX, TROPONINI in the last 168 hours. BNP (last 3 results) No results for input(s): PROBNP in the last 8760 hours. HbA1C: No results for input(s): HGBA1C in the last 72 hours. CBG: No results for input(s): GLUCAP in the last 168 hours. Lipid Profile: No results for input(s): CHOL, HDL, LDLCALC, TRIG, CHOLHDL, LDLDIRECT in the last 72 hours. Thyroid Function Tests: No results for input(s): TSH, T4TOTAL, FREET4, T3FREE, THYROIDAB in the last 72 hours. Anemia Panel: No results for input(s): VITAMINB12, FOLATE, FERRITIN, TIBC, IRON, RETICCTPCT in the last 72 hours. Urine analysis: No results found for: COLORURINE, APPEARANCEUR, LABSPEC, PHURINE, GLUCOSEU, HGBUR, BILIRUBINUR, KETONESUR, PROTEINUR, UROBILINOGEN, NITRITE, LEUKOCYTESUR Sepsis Labs: @LABRCNTIP (procalcitonin:4,lacticidven:4)  ) Recent Results (from the past 240 hour(s))  Respiratory Panel by RT PCR (Flu A&B, Covid) - Nasopharyngeal Swab     Status: None   Collection Time: 03/11/19 12:10 PM   Specimen: Nasopharyngeal Swab  Result Value Ref Range Status   SARS Coronavirus 2 by RT PCR NEGATIVE NEGATIVE Final    Comment: (NOTE) SARS-CoV-2 target nucleic acids are NOT DETECTED. The SARS-CoV-2 RNA is generally detectable in upper respiratoy specimens during the acute phase of infection. The lowest concentration of SARS-CoV-2 viral copies this assay can detect is 131 copies/mL. A negative result does not preclude SARS-Cov-2 infection and should not be used as the  sole basis for treatment or other patient management decisions. A negative result may occur with  improper specimen collection/handling, submission of specimen other than nasopharyngeal swab, presence of viral mutation(s) within the areas targeted by this assay, and inadequate number of viral copies (<131 copies/mL). A negative result must be combined with clinical observations, patient history, and epidemiological information. The expected result is Negative. Fact Sheet for Patients:  PinkCheek.be Fact Sheet for Healthcare Providers:  GravelBags.it This test is not yet ap proved or cleared by the [place] FDA and  has been authorized for detection and/or diagnosis of SARS-CoV-2 by FDA under an Emergency Use Authorization (EUA). This EUA will remain  in effect (meaning this test can be used) for the duration of the COVID-19 declaration under Section 564(b)(1) of the Act, 21 U.S.C. section 360bbb-3(b)(1), unless the authorization is terminated or revoked sooner.    Influenza A by PCR NEGATIVE NEGATIVE Final   Influenza B by PCR NEGATIVE NEGATIVE Final    Comment: (NOTE) The Xpert Xpress SARS-CoV-2/FLU/RSV assay is intended as an aid in  the diagnosis of influenza from Nasopharyngeal swab specimens and  should not be used as a sole basis for treatment. Nasal washings and  aspirates are unacceptable for Xpert Xpress SARS-CoV-2/FLU/RSV  testing. Fact Sheet for Patients: PinkCheek.be Fact Sheet for Healthcare Providers: GravelBags.it This test  is not yet approved or cleared by the [place] and  has been authorized for detection and/or diagnosis of SARS-CoV-2 by  FDA under an Emergency Use Authorization (EUA). This EUA will remain  in effect (meaning this test can be used) for the duration of the  Covid-19 declaration under Section 564(b)(1) of the Act, 21    U.S.C. section 360bbb-3(b)(1), unless the authorization is  terminated or revoked. Performed at [facility], [place] [address] [place], [place] [address]      Studies: No results found.  Scheduled Meds: . dexamethasone (DECADRON) injection  10 mg Intravenous TID  . pantoprazole  40 mg Oral Daily    Continuous Infusions: . sodium chloride 75 mL/hr at 03/12/19 2332  . lactated ringers 10 mL/hr at 03/12/19 1310     LOS: 2 days   Time spent: 21 minutes  Margarine Grosshans Marry Guan, MD [facility] Pager [phone]  If 7PM-7AM, please contact night-coverage [email or web address] Password [facility] 03/13/2019, 8:41 AM

## 2019-03-13 NOTE — Progress Notes (Signed)
Called [facility] and set up appointment at cancer center tomorrow 03/13/18 and let them know he needed to arrive by 0930.

## 2019-03-13 NOTE — Evaluation (Addendum)
Clinical/Bedside Swallow Evaluation Patient Details  Name: Ross Cameron MRN: [id number] Date of Birth: 18-Aug-1923  Today's Date: 03/13/2019 Time: SLP Start Time (ACUTE ONLY): 1010 SLP Stop Time (ACUTE ONLY): 1026 SLP Time Calculation (min) (ACUTE ONLY): 16 min  Past Medical History:  Past Medical History:  Diagnosis Date  . Anemia   . Anxiety    not treated  . Arthritis   . Cancer ([place])    neck cancer, skin cancer - melanoma (ear)  . Complication of anesthesia    Pt has a problem with breathing and swallowing when laying flat on his back  . Constipation    uses prune juice  . Depression   . GERD (gastroesophageal reflux disease)   . HOH (hard of hearing)    Hx; of does not wear hearing aids  . Hypertension   . Macular degeneration    Hx: of  . Memory deficits    per daughter  . Psoriasis    Hx: of   Past Surgical History:  Past Surgical History:  Procedure Laterality Date  . APPENDECTOMY    . CATARACT EXTRACTION W/ INTRAOCULAR LENS  IMPLANT, BILATERAL     Hx: of  . COLONOSCOPY W/ BIOPSIES AND POLYPECTOMY     Hx: of  . ENDOBRONCHIAL ULTRASOUND N/A 03/12/2019   Procedure: ENDOBRONCHIAL ULTRASOUND;  Surgeon: Candee Furbish, MD;  Location: [facility];  Service: Endoscopy;  Laterality: N/A;  . PAROTIDECTOMY Right 11/18/2012   Procedure: RIGHT PAROTIDECTOMY;  Surgeon: Melida Quitter, MD;  Location: [place];  Service: ENT;  Laterality: Right;  . RADICAL NECK DISSECTION Right 11/18/2012   Procedure: RIGHT RADICAL NECK DISSECTION/Right Selective Neck Dissection  ;  Surgeon: Melida Quitter, MD;  Location: [place];  Service: ENT;  Laterality: Right;  Right Selective Neck Dissection  14mins  . TONSILLECTOMY    . VIDEO BRONCHOSCOPY N/A 03/12/2019   Procedure: BRONCHOSCOPY WITH EBUS;  Surgeon: Candee Furbish, MD;  Location: [place];  Service: Endoscopy;  Laterality: N/A;   HPI:  Ross Cameron is a 86 y.o. 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 Per chart had plans to undergo biopsy of the tracheal mass mass, but was seen to be significantly short of breath in the preop setting and sent to the ED for further evaluation. Per chart patient reports that he has been having difficulty swallowing food and some of his medication. Chest x-ray revealed atherosclerosis, but no other acute signs of disease. Dr. Redmond Baseman of ENT evaluated the patient in the emergency department with cnsults to PCCM for possible biopsy and radiation oncology for palliative radiation were made.    Assessment / Plan / Recommendation Clinical Impression  Pt's history of  vocal cord dysfunction, squamous cell carcinoma right parotid cancer (surgery 2014) and recent discovery of tracheal mass (biopsy yesterday) increases his aspiration risk. While he demonstrated immediate throat clearing with thin with 80% trials thin and likely airway intrusion, reflexively he subjectively appears to be sensation with clear vocal quality. Mild left labial/facial weakness probably related to surgical intervention but he manages to clear with liquid wash. SLP did not observe s/s aspiration with pill and straw sip water with RN. Suspect pt has been able to manage his baseline dysphagia and denies recent pna, current CXR clear however tracheal mass is a  significant change. Pt in agreement to Dys 3 with report of difficulty masticating meats, thin liquids, pills with thin (single). Will continue to intervene.   SLP Visit Diagnosis: Dysphagia, pharyngeal phase (R13.13)    Aspiration Risk  Mild aspiration risk;Moderate aspiration risk    Diet Recommendation Dysphagia 3 (Mech soft);Thin liquid   Liquid Administration via: Cup;Straw Medication Administration: Whole meds with liquid Supervision:  Patient able to self feed;Intermittent supervision to cue for compensatory strategies Compensations: Slow rate;Small sips/bites Postural Changes: Seated upright at 90 degrees    Other  Recommendations Oral Care Recommendations: Oral care BID   Follow up Recommendations (TBD)      Frequency and Duration min 2x/week  2 weeks       Prognosis Prognosis for Safe Diet Advancement: Fair Barriers to Reach Goals: Other (Comment)(hs dysphagia)      Swallow Study   General HPI: Ross Cameron is a 86 y.o. 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 Per chart had plans to undergo biopsy of the tracheal mass mass, but was seen to be significantly short of breath in the preop setting and sent to the ED for further evaluation. Per chart patient reports that he has been having difficulty swallowing food and some of his medication. Chest x-ray revealed atherosclerosis, but no other acute signs of disease. Dr. Redmond Baseman of ENT evaluated the patient in the emergency department with cnsults to PCCM for possible biopsy and radiation oncology for palliative radiation were made.  Type of Study: Bedside Swallow Evaluation Previous Swallow Assessment: (none in [facility]-could not locate in care everywhere) Diet Prior to this Study: NPO Temperature Spikes Noted: No Respiratory Status: Nasal cannula History of Recent Intubation: No Behavior/Cognition: Alert;Requires cueing Oral Cavity Assessment: Within Functional Limits Oral Care Completed by SLP: No Oral Cavity - Dentition: Other (Comment)(lower partial, some upper) Vision: Functional for self-feeding Self-Feeding Abilities: Able to feed self Patient Positioning: Upright in chair Baseline Vocal Quality: Normal Volitional  Cough: Strong Volitional Swallow: Able to elicit    Oral/Motor/Sensory Function Overall Oral Motor/Sensory Function: Mild impairment Facial ROM: Reduced left Facial Symmetry: Abnormal symmetry left Facial Strength: Reduced left   Ice Chips Ice chips: Not tested   Thin Liquid Thin Liquid: Impaired Pharyngeal  Phase Impairments: Throat Clearing - Immediate;Wet Vocal Quality    Nectar Thick Nectar Thick Liquid: Not tested   Honey Thick Honey Thick Liquid: Not tested   Puree Puree: Within functional limits   Solid     Solid: Impaired Pharyngeal Phase Impairments: Throat Clearing - Delayed      Houston Siren 03/13/2019,10:47 AM  Orbie Pyo Colvin Caroli.Ed Risk analyst [phone] Office [phone]

## 2019-03-13 NOTE — Progress Notes (Signed)
I spoke with the patient's daughter Karlyne Greenspan. She confirms that she has been the most active of family members caring for the patient as she lives with the patient and for the last year has been caring for both he and her mother who is also 18. She has had in depth conversations with Dr. Redmond Baseman and is aware of the poor prognosis her father has due to the concern for an advanced malignancy. We discussed that Dr. Tamala Julian and Dr. Lisbeth Renshaw are in agreement about the options of palliative radiotherapy to the mediastinum. The goals would be to prevent further bleeding from this tumor, and hopefully improve his comfort swallowing. Given the time that has past it is unlikely that his laryngeal nerve palsy/paralysis would improve. The patient's daughter would be the one to consent for his treatment as he remains confused. She would like to think about this as well as the discussion we've had about a 10 fraction course over 2 weeks considering risks, benefits, short, and long term side effects. We were originally going to try to proceed with simulation tomorrow to start radiation on Monday. However, she would like to meet with palliative care tomorrow prior to making any further decisions on moving forward. Hopefully he can go home soon, at a minimum of palliative care in the outpatient following him, and with the intention of converting care to hospice, or proceeding with radiation then enrolling hospice care at the completion. For now we will move simulation until Monday of next week. She is also aware of the possible limitations of positioning him for this and that his ability to tolerate laying flat for radiotherapy could also prevent him from proceeding with radiation as well. We will have to see how this goes if they wish to proceed.     Carola Rhine, PAC

## 2019-03-13 NOTE — Progress Notes (Signed)
Spoke with Suezanne Jacquet RN at [facility] to let him know that the patient is scheduled for CT simulation at the cancer center 03/14/2019.  I asked that he call [facility] and set up transport to have him here at 9:30 am for a 10:00 simulation.  [facility] phone number provided, will continue to follow as necessary.  Gloriajean Dell. Leonie Green, BSN

## 2019-03-14 ENCOUNTER — Encounter (HOSPITAL_COMMUNITY): Admission: RE | Payer: Self-pay | Source: Home / Self Care

## 2019-03-14 ENCOUNTER — Ambulatory Visit: Payer: Medicare Other | Admitting: Radiation Oncology

## 2019-03-14 ENCOUNTER — Ambulatory Visit (HOSPITAL_COMMUNITY): Admission: RE | Admit: 2019-03-14 | Payer: Medicare Other | Source: Home / Self Care | Admitting: Otolaryngology

## 2019-03-14 ENCOUNTER — Ambulatory Visit
Admission: RE | Admit: 2019-03-14 | Discharge: 2019-03-14 | Disposition: A | Discharge status: Home / Self Care | Source: Ambulatory Visit | Attending: Radiation Oncology | Admitting: Radiation Oncology

## 2019-03-14 DIAGNOSIS — R0602 Shortness of breath: Secondary | ICD-10-CM

## 2019-03-14 DIAGNOSIS — Z7189 Other specified counseling: Secondary | ICD-10-CM

## 2019-03-14 DIAGNOSIS — R1314 Dysphagia, pharyngoesophageal phase: Secondary | ICD-10-CM

## 2019-03-14 DIAGNOSIS — F19959 Other psychoactive substance use, unspecified with psychoactive substance-induced psychotic disorder, unspecified: Secondary | ICD-10-CM

## 2019-03-14 DIAGNOSIS — Z515 Encounter for palliative care: Secondary | ICD-10-CM

## 2019-03-14 DIAGNOSIS — J383 Other diseases of vocal cords: Secondary | ICD-10-CM

## 2019-03-14 DIAGNOSIS — F419 Anxiety disorder, unspecified: Secondary | ICD-10-CM

## 2019-03-14 DIAGNOSIS — Z85818 Personal history of malignant neoplasm of other sites of lip, oral cavity, and pharynx: Secondary | ICD-10-CM

## 2019-03-14 DIAGNOSIS — C383 Malignant neoplasm of mediastinum, part unspecified: Secondary | ICD-10-CM | POA: Insufficient documentation

## 2019-03-14 DIAGNOSIS — C33 Malignant neoplasm of trachea: Secondary | ICD-10-CM

## 2019-03-14 LAB — CBC
HCT: 31.8 % — ABNORMAL LOW (ref 39.0–52.0)
Hemoglobin: 10.6 g/dL — ABNORMAL LOW (ref 13.0–17.0)
MCH: 29.4 pg (ref 26.0–34.0)
MCHC: 33.3 g/dL (ref 30.0–36.0)
MCV: 88.1 fL (ref 80.0–100.0)
Platelets: 210 10*3/uL (ref 150–400)
RBC: 3.61 MIL/uL — ABNORMAL LOW (ref 4.22–5.81)
RDW: 13.3 % (ref 11.5–15.5)
WBC: 10.1 10*3/uL (ref 4.0–10.5)
nRBC: 0 % (ref 0.0–0.2)

## 2019-03-14 LAB — BASIC METABOLIC PANEL
Anion gap: 9 (ref 5–15)
BUN: 14 mg/dL (ref 8–23)
CO2: 25 mmol/L (ref 22–32)
Calcium: 9 mg/dL (ref 8.9–10.3)
Chloride: 102 mmol/L (ref 98–111)
Creatinine, Ser: 0.58 mg/dL — ABNORMAL LOW (ref 0.61–1.24)
GFR calc Af Amer: >60 mL/min (ref >60)
GFR calc non Af Amer: >60 mL/min (ref >60)
Glucose, Bld: 126 mg/dL — ABNORMAL HIGH (ref 70–99)
Potassium: 4.1 mmol/L (ref 3.5–5.1)
Sodium: 136 mmol/L (ref 135–145)

## 2019-03-14 LAB — CYTOLOGY - NON PAP

## 2019-03-14 SURGERY — ESOPHAGOSCOPY, RIGID
Anesthesia: General

## 2019-03-14 MED ORDER — LORAZEPAM 0.5 MG PO TABS
0.5000 mg | ORAL_TABLET | Freq: Four times a day (QID) | ORAL | Status: DC | PRN
  Administered 2019-03-14: 0.5 mg via SUBLINGUAL
  Filled 2019-03-14: qty 1

## 2019-03-14 MED ORDER — MORPHINE SULFATE (CONCENTRATE) 10 MG/0.5ML PO SOLN
5.0000 mg | ORAL | Status: DC | PRN
  Administered 2019-03-14: 5 mg via SUBLINGUAL
  Filled 2019-03-14: qty 0.5

## 2019-03-14 NOTE — Progress Notes (Signed)
PROGRESS NOTE  Ross Cameron WEX:[id number] DOB: 07/07/1923 DOA: 03/11/2019 PCP: Cyndi Bender, [facility] Course/Subjective: Ross Cameron is a 86 y.o. 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 Admitted for biopsy of left tracheal mass due to SOB and dysphagia, consulted ENT, PCCM, radiation oncology. Rad onc would like to await pathology before initiation radiation, and he is s/p bronchoscopy with biopsy on 03/12/19. Palliative care has also been consulted.  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  CT scan of the neck from 02/26/19 noted the left lateral tracheal mass extending into the mediastinum and possibly into the esophagus.  Dr. Redmond Baseman of ENT consulted and consulted PCCM for possible biopsy biopsy and radiation oncology for possible palliative radiation -Aspiration precautions -Elevate head of bed -Racemic epinephrine as needed -decrease Decadron to 2 mg IV 3 times daily for 1 more day ( due to steroid induced delirium/psychosis), adjust dose per critical care recommendation -Follow-up CT scan of the chest when able to be obtained -Appreciate ENT, PCCM, and radiation oncology - s/p bronch with bx 2/3, pending pathology report - palliative care family meeting 2/5. Per radiation oncologist, "together with Dr. Tamala Julian and Dr. Lisbeth Renshaw are in agreement about the options of palliative radiotherapy to the mediastinum. The goals would be to prevent further bleeding from this tumor, and hopefully improve his comfort swallowing. Given the time that has past it is unlikely that his laryngeal nerve palsy/paralysis would improve. The patient's daughter would be the one to consent for his treatment as he remains confused. She would like to think about this as well as  the discussion we've had about a 10 fraction course over 2 weeks considering risks, benefits, short, and long term side effects. She would like to meet with palliative care on 2/5 prior to making any further decisions on moving forward. Hopefully he can go home soon, at a minimum of palliative care in the outpatient following him, and with the intention of converting care to hospice, or proceeding with radiation then enrolling hospice care at the completion." pt is still full code up to now  Dysphagia: Secondary to tracheal mass. -Speech therapy to evaluate and give recommendations -diet is modified to dysphagia 3: mechanical soft and thin liquid. No overt aspiration seen  Essential hypertension: Patient normally on lisinopril 20 mg daily. -Consider restarting when medically appropriate, currently normotensive  Normocytic anemia: Hemoglobin stable today.  Patient has been coughing up blood-tinged sputum, but no gross hemoptysis appreciated -Continue to monitor  Hyponatremia: resolved; pt has been on iv NS since 03/11/19, given Na normalized, will d/c IVF to avoid volume overload  Steroid induced psychosis and delirium: cut down the dose and duration of decadron as above, and close monitor the mental status  Anxiety: Patient not on anything for anxiety at home. -Ativan IV as needed  DVT prophylaxis: SCD  Code Status: Full for now.  Family wants to discuss this more, meeting on 2/5.  Family Communication: None present this AM patient awake and oriented  Disposition Plan: To be determined Consults called: ENT, PCCM, radiation oncology, pall care Admission status: Inpatient   Objective: Vitals:   03/13/19 2255 03/14/19 0400 03/14/19 0500 03/14/19 0726  BP: (!) 142/89 (!) 146/72  130/80  Pulse: 98 66  61  Resp: 20 19  16   Temp: 98.1 F (36.7 C)  98 F (36.7 C)  98.6 F (37 C)  TempSrc: Oral Oral  Oral  SpO2: 98% 100%  100%  Weight:   63.9 kg   Height:        Intake/Output  Summary (Last 24 hours) at 03/14/2019 2947 Last data filed at 03/14/2019 0700 Gross per 24 hour  Intake 3702.6 ml  Output --  Net 3702.6 ml   Filed Weights   03/12/19 1317 03/14/19 0500  Weight: 63.5 kg 63.9 kg     Exam: General:  Alert, oriented, calm, in no acute distress, sitting upright on 2L O2 Eyes: EOMI, clear sclerea Neck: supple, nontender Cardiovascular: RRR, no murmurs or rubs, no peripheral edema  Respiratory: clear to auscultation bilaterally, no wheezes, no crackles, upper airway wetness Abdomen: soft, nontender, nondistended, normal bowel tones heard  Skin: dry, no rashes  Musculoskeletal: no joint effusions, normal range of motion  Psychiatric: appropriate affect, normal speech  Neurologic: extraocular muscles intact, clear speech, moving all extremities with intact sensorium    Data Reviewed: CBC: Recent Labs  Lab 03/11/19 1044 03/12/19 0210 03/13/19 0329 03/14/19 0206  WBC 8.0 4.3 11.1* 10.1  HGB 11.7* 10.5* 10.9* 10.6*  HCT 35.9* 31.2* 32.8* 31.8*  MCV 90.7 87.6 89.9 88.1  PLT 257 205 203 654   Basic Metabolic Panel: Recent Labs  Lab 03/11/19 1044 03/12/19 0210 03/13/19 0329 03/14/19 0206  NA 130* 132* 134* 136  K 4.9 4.1 4.9 4.1  CL 95* 98 105 102  CO2 25 23 21* 25  GLUCOSE 108* 231* 135* 126*  BUN 7* 11 12 14   CREATININE 0.73 0.60* 0.66 0.58*  CALCIUM 9.5 8.8* 9.0 9.0   GFR: Estimated Creatinine Clearance: 51 mL/min (A) (by C-G formula based on SCr of 0.58 mg/dL (L)). Liver Function Tests: Recent Labs  Lab 03/11/19 1044  AST 21  ALT 18  ALKPHOS 80  BILITOT 0.8  PROT 6.4*  ALBUMIN 3.4*   No results for input(s): LIPASE, AMYLASE in the last 168 hours. No results for input(s): AMMONIA in the last 168 hours. Coagulation Profile: No results for input(s): INR, PROTIME in the last 168 hours. Cardiac Enzymes: No results for input(s): CKTOTAL, CKMB, CKMBINDEX, TROPONINI in the last 168 hours. BNP (last 3 results) No results for  input(s): PROBNP in the last 8760 hours. HbA1C: No results for input(s): HGBA1C in the last 72 hours. CBG: No results for input(s): GLUCAP in the last 168 hours. Lipid Profile: No results for input(s): CHOL, HDL, LDLCALC, TRIG, CHOLHDL, LDLDIRECT in the last 72 hours. Thyroid Function Tests: No results for input(s): TSH, T4TOTAL, FREET4, T3FREE, THYROIDAB in the last 72 hours. Anemia Panel: No results for input(s): VITAMINB12, FOLATE, FERRITIN, TIBC, IRON, RETICCTPCT in the last 72 hours. Urine analysis: No results found for: COLORURINE, APPEARANCEUR, LABSPEC, PHURINE, GLUCOSEU, HGBUR, BILIRUBINUR, KETONESUR, PROTEINUR, UROBILINOGEN, NITRITE, LEUKOCYTESUR Sepsis Labs: @LABRCNTIP (procalcitonin:4,lacticidven:4)  ) Recent Results (from the past 240 hour(s))  Respiratory Panel by RT PCR (Flu A&B, Covid) - Nasopharyngeal Swab     Status: None   Collection Time: 03/11/19 12:10 PM   Specimen: Nasopharyngeal Swab  Result Value Ref Range Status   SARS Coronavirus 2 by RT PCR NEGATIVE NEGATIVE Final    Comment: (NOTE) SARS-CoV-2 target nucleic acids are NOT DETECTED. The SARS-CoV-2 RNA is generally detectable in upper respiratoy specimens during the acute phase of infection. The lowest concentration of SARS-CoV-2 viral copies this assay can detect is 131 copies/mL. A negative result does not preclude SARS-Cov-2 infection and should not  be used as the sole basis for treatment or other patient management decisions. A negative result may occur with  improper specimen collection/handling, submission of specimen other than nasopharyngeal swab, presence of viral mutation(s) within the areas targeted by this assay, and inadequate number of viral copies (<131 copies/mL). A negative result must be combined with clinical observations, patient history, and epidemiological information. The expected result is Negative. Fact Sheet for Patients:  PinkCheek.be Fact Sheet for  Healthcare Providers:  GravelBags.it This test is not yet ap proved or cleared by the [place] FDA and  has been authorized for detection and/or diagnosis of SARS-CoV-2 by FDA under an Emergency Use Authorization (EUA). This EUA will remain  in effect (meaning this test can be used) for the duration of the COVID-19 declaration under Section 564(b)(1) of the Act, 21 U.S.C. section 360bbb-3(b)(1), unless the authorization is terminated or revoked sooner.    Influenza A by PCR NEGATIVE NEGATIVE Final   Influenza B by PCR NEGATIVE NEGATIVE Final    Comment: (NOTE) The Xpert Xpress SARS-CoV-2/FLU/RSV assay is intended as an aid in  the diagnosis of influenza from Nasopharyngeal swab specimens and  should not be used as a sole basis for treatment. Nasal washings and  aspirates are unacceptable for Xpert Xpress SARS-CoV-2/FLU/RSV  testing. Fact Sheet for Patients: PinkCheek.be Fact Sheet for Healthcare Providers: GravelBags.it This test is not yet approved or cleared by the [place] FDA and  has been authorized for detection and/or diagnosis of SARS-CoV-2 by  FDA under an Emergency Use Authorization (EUA). This EUA will remain  in effect (meaning this test can be used) for the duration of the  Covid-19 declaration under Section 564(b)(1) of the Act, 21  U.S.C. section 360bbb-3(b)(1), unless the authorization is  terminated or revoked. Performed at [facility], [place] [address], [place], [place] [address]      Studies: No results found.  Scheduled Meds: . dexamethasone (DECADRON) injection  2 mg Intravenous TID  . pantoprazole  40 mg Oral Daily  . QUEtiapine  25 mg Oral BID    Continuous Infusions: . sodium chloride 75 mL/hr at 03/13/19 2257  . lactated ringers 10 mL/hr at 03/12/19 1310     LOS: 3 days   Time spent: 25 minutes  Paticia Stack, MD [facility] Pager  [phone]  If 7PM-7AM, please contact night-coverage [email or web address] Password [facility] 03/14/2019, 8:28 AM

## 2019-03-14 NOTE — Progress Notes (Signed)
The chaplain visited as a result of a consult.  The chaplain prayed with the patient.  There is not an assessed need for follow-up.  Brion Aliment Chaplain Resident For questions concerning this note please contact me by pager [phone]

## 2019-03-14 NOTE — Progress Notes (Signed)
[facility]:  [place] to the daughter Karlyne Greenspan and his wife Mx. Trainer. They are both in agreement with Hospice services when pt is discharged home. The pt will be able to get 10 radiation treatments if he chooses under the hospice benefit with [facility]. I have ordered hospital bed, over bed table, shower chair, BSC oxygen and wheelchair to be delivered to the home.  Thank you for the opportunity to help care for our community and this referral. Plan to enroll pt in hospice services when discharged.   Contact [facility] with any questions [phone].

## 2019-03-14 NOTE — Progress Notes (Signed)
  Speech Language Pathology Treatment: Dysphagia  Patient Details Name: Ross Cameron MRN: [id number] DOB: 1923/03/26 Today's Date: 03/14/2019 Time: 2725-3664 SLP Time Calculation (min) (ACUTE ONLY): 9 min  Assessment / Plan / Recommendation Clinical Impression  Pt seen for diet tolerance follow up.  Family present and discussing plan of care with Palliative NP.  Pt and family report distaste for AM meal.  Pt had to spit some food out.  Family reports possible choking with intake this morning.  Pt consumed trials of puree, regular solid, and thin liquid with SLP.  There were no clinical s/s of aspiration.  Pt exhibited good oral clearance of solid with extra time.   Recommend continuing mechanical soft diet with thin liquid.  Pt may discharge today with readmission text week to pursue treatment.  SLP will not sign off at this time, but will monitor for additional needs should pt remain in-house.     HPI HPI: RUE TINNEL is a 86 y.o. 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 Per chart had plans to undergo biopsy of the tracheal mass mass, but was seen to be significantly short of breath in the preop setting and sent to the ED for further evaluation. Per chart patient reports that he has been having difficulty swallowing food and some of his medication. Chest x-ray revealed atherosclerosis, but no other acute signs of disease. Dr. Redmond Baseman of ENT evaluated the patient in the emergency department with cnsults to PCCM for possible biopsy and radiation oncology for palliative radiation were made.       SLP Plan  Continue with current plan of care       Recommendations  Diet recommendations: Dysphagia 3 (mechanical soft);Thin liquid Liquids provided  via: Straw Medication Administration: Whole meds with liquid Supervision: Patient able to self feed Compensations: Slow rate;Small sips/bites Postural Changes and/or Swallow Maneuvers: Seated upright 90 degrees                Oral Care Recommendations: Oral care BID Follow up Recommendations: None SLP Visit Diagnosis: Dysphagia, pharyngeal phase (R13.13) Plan: Continue with current plan of care       [place], [place], [place] Office: [phone]  03/14/2019, 11:50 AM

## 2019-03-14 NOTE — Care Management (Addendum)
CM acknowledges consult for setting up pt with the Palliative outpatient to with [facility].   Agency will check with [facility] and see if pt can be accepted as pt does not have banner.    Update:  Pt will now go home with Home Hospice provided by [facility] tentatively tomorrow.  Referral to agency also given directly from [facility].   Hospice agency has accepted pt and will order home equipment.

## 2019-03-14 NOTE — Consult Note (Signed)
Consultation Note Date: 03/14/2019   Patient Name: Ross Cameron  DOB: 08/05/1923  MRN: [id number]  Age / Sex: 86 y.o., male  PCP: Cyndi Bender, PA-C Referring Physician: Paticia Stack, MD  Reason for Consultation: Establishing goals of care  HPI/Patient Profile: 86 y.o. male  with past medical history of parotid cancer s/p surgery 2014, anxiety, depression, HTN, vocal cord dysfunction, tracheal mass on 1/20 CT scan admitted on 03/11/2019 with shortness of breath related to tracheal mass at the level of the aorta with extenstion in the mediastinum and possibly into the esophagus as well as cervical lymphadenopathy. Bronchoscopy biopsy performed with findings of squamous cell carcinoma. Radiation consulted and offered palliative radiation treatment to prevent bleeding and improve his swallow. Palliative medicine consulted for goals of care.    Clinical Assessment and Goals of Care:  I have reviewed medical records including EPIC notes, labs and imaging, received report from Shona Simpson, [facility] in radiation, met at bedside with his two daughters to discuss diagnosis prognosis, GOC, EOL wishes, disposition and options. His spouse is not able to participate as she has some mild dementia and defers to her daughters for decision making. Mr. Polio is very hard of hearing and somewhat confused and not able to participate as well.   I introduced Palliative Medicine as specialized medical care for people living with serious illness. It focuses on providing relief from the symptoms and stress of a serious illness.   We discussed a brief life review of the patient. He is married lives at home with his spouse. He daughters and grandchildren assist in his care. He is of [employer].  As far as functional and nutritional status - they have noticed decline over the last several months. He has lost a great deal of weight. He  lives bed to chair existence.   We discussed his current illness and what it means in the larger context of her on-going co-morbidities.  Natural disease trajectory and expectations at EOL were discussed.  I attempted to elicit values and goals of care important to the patient. His daughters state that comfort and being home is of most importance. They are realistic that he is at EOL and will die from his cancer- or possibly an aspiration event leading to pneumonia.     The difference between aggressive medical intervention and comfort care was considered in light of the patient's goals of care.   Advanced directives, concepts specific to code status, artifical feeding and hydration, and rehospitalization were considered and discussed. All agree to DNR status. After he completes radiation they would like to transition to full comfort and Hospice care at home. No artificial feeding or hydration.   Hospice and Palliative Care services outpatient were explained and offered.   Hard choices book given.   Questions and concerns were addressed.  The family was encouraged to call with questions or concerns.    Primary Decision Maker NEXT OF KIN daughters-     SUMMARY OF RECOMMENDATIONS -Plan to attempt CT sim and  palliative radiation therapy- daughters would like patient to come home after his CT sim- after discussion with Dr. Crisoforo Oxford, Shona Simpson, PA and Sam in care management- plan will be for CT Sim today and target discharge home tomorrow.  -Discussed patient with Cheri from [facility]- she is going to discuss patient with their medical director and hopes that patient may be able to be admitted directly to Hospice services while completing his palliative radiation treatments.  -Symptom management-   -Anxiety- lorazepam .46m sublingual q6hr - please use as premed prior to CT sim  -SOB- morphine concentrated solution- 520mq2hr prn -Chaplain consult requested     Code Status/Advance  Care Planning:  DNR  Palliative Prophylaxis:   Aspiration and Frequent Pain Assessment  Additional Recommendations (Limitations, Scope, Preferences):  Avoid Hospitalization, Minimize Medications, No Artificial Feeding and No Surgical Procedures  Psycho-social/Spiritual:   Desire for further Chaplaincy support:yes  Prognosis:    < 6 months due to advanced tracheal squamous cell carcinoma without plans for aggressive treatment- evidenced by significant functional decline and weight loss  Discharge Planning: Home with Hospice  Primary Diagnoses: Present on Admission: . Tracheal mass . Mediastinal mass . (Resolved) Hyponatremia . Normocytic anemia . Essential hypertension . Anxiety . Steroid-induced psychosis with complication ([facility]. Vocal cord dysfunction . History of parotid cancer   I have reviewed the medical record, interviewed the patient and family, and examined the patient. The following aspects are pertinent.  Past Medical History:  Diagnosis Date  . Anemia   . Anxiety    not treated  . Arthritis   . Cancer ([facility]   neck cancer, skin cancer - melanoma (ear)  . Complication of anesthesia    Pt has a problem with breathing and swallowing when laying flat on his back  . Constipation    uses prune juice  . Depression   . GERD (gastroesophageal reflux disease)   . HOH (hard of hearing)    Hx; of does not wear hearing aids  . Hypertension   . Macular degeneration    Hx: of  . Memory deficits    per daughter  . Psoriasis    Hx: of   Social History   Socioeconomic History  . Marital status: Married    Spouse name: Not on file  . Number of children: Not on file  . Years of education: Not on file  . Highest education level: Not on file  Occupational History  . Not on file  Tobacco Use  . Smoking status: Former Smoker    Types: Pipe, CiLandscape architect. Smokeless tobacco: Never Used  . Tobacco comment: "smoked a pipe many years ago"  Substance and Sexual  Activity  . Alcohol use: Not Currently    Comment: none in the past 3 years (03/11/19)4 drinks of "BlPilgrim's Prideaily; totals 4 ounces daily"  . Drug use: No  . Sexual activity: Not on file  Other Topics Concern  . Not on file  Social History Narrative  . Not on file   Social Determinants of Health   Financial Resource Strain:   . Difficulty of Paying Living Expenses: Not on file  Food Insecurity:   . Worried About RuCharity fundraisern the Last Year: Not on file  . Ran Out of Food in the Last Year: Not on file  Transportation Needs:   . Lack of Transportation (Medical): Not on file  . Lack of Transportation (Non-Medical): Not on file  Physical Activity:   .  Days of Exercise per Week: Not on file  . Minutes of Exercise per Session: Not on file  Stress:   . Feeling of Stress : Not on file  Social Connections:   . Frequency of Communication with Friends and Family: Not on file  . Frequency of Social Gatherings with Friends and Family: Not on file  . Attends Religious Services: Not on file  . Active Member of Clubs or Organizations: Not on file  . Attends Archivist Meetings: Not on file  . Marital Status: Not on file   Family History  Problem Relation Age of Onset  . Cancer Other    Scheduled Meds: . dexamethasone (DECADRON) injection  2 mg Intravenous TID  . pantoprazole  40 mg Oral Daily  . QUEtiapine  25 mg Oral BID   Continuous Infusions: . lactated ringers 10 mL/hr at 03/12/19 1310   PRN Meds:.acetaminophen **OR** acetaminophen, LORazepam, morphine CONCENTRATE, ondansetron **OR** ondansetron (ZOFRAN) IV, Racepinephrine HCl Medications Prior to Admission:  Prior to Admission medications   Medication Sig Start Date End Date Taking? Authorizing Provider  lisinopril (PRINIVIL,ZESTRIL) 20 MG tablet Take 20 mg by mouth daily.   Yes [provider]  omeprazole (PRILOSEC) 20 MG capsule Take 20 mg by mouth daily as needed (heartburn/indigestion).    Yes  [provider]   Allergies  Allergen Reactions  . Aspirin     Causes bleeding   Review of Systems  Physical Exam Vitals and nursing note reviewed.  Constitutional:      Comments: frail  Cardiovascular:     Rate and Rhythm: Tachycardia present.  Pulmonary:     Effort: Pulmonary effort is normal.  Neurological:     Mental Status: He is disoriented.  Psychiatric:     Comments: Impaired insight     Vital Signs: BP 130/80 (BP Location: Left Arm)   Pulse 61   Temp 98.6 F (37 C) (Oral)   Resp 16   Ht 5' 7"  (1.702 m)   Wt 63.9 kg   SpO2 100%   BMI 22.06 kg/m  Pain Scale: 0-10 POSS *See Group Information*: 1-Acceptable,Awake and alert Pain Score: 0-No pain   SpO2: SpO2: 100 % O2 Device:SpO2: 100 % O2 Flow Rate: .O2 Flow Rate (L/min): 3 L/min  IO: Intake/output summary:   Intake/Output Summary (Last 24 hours) at 03/14/2019 1159 Last data filed at 03/14/2019 0700 Gross per 24 hour  Intake 3702.6 ml  Output -  Net 3702.6 ml    LBM: Last BM Date: 03/11/19 Baseline Weight: Weight: 63.5 kg Most recent weight: Weight: 63.9 kg     Palliative Assessment/Data: PPS: 40%     Thank you for this consult. Palliative medicine will continue to follow and assist as needed.   Time In: 1100 Time Out: 1250 Time Total: 110 minutes Prolonged services billed- yes Greater than 50%  of this time was spent counseling and coordinating care related to the above assessment and plan.  Signed by: Mariana Kaufman, AGNP-C Palliative Medicine    Please contact Palliative Medicine Team phone at [phone] for questions and concerns.  For individual provider: See Shea Evans

## 2019-03-15 DIAGNOSIS — C3490 Malignant neoplasm of unspecified part of unspecified bronchus or lung: Principal | ICD-10-CM

## 2019-03-15 LAB — BASIC METABOLIC PANEL
Anion gap: 8 (ref 5–15)
BUN: 11 mg/dL (ref 8–23)
CO2: 22 mmol/L (ref 22–32)
Calcium: 8.3 mg/dL — ABNORMAL LOW (ref 8.9–10.3)
Chloride: 100 mmol/L (ref 98–111)
Creatinine, Ser: 0.5 mg/dL — ABNORMAL LOW (ref 0.61–1.24)
GFR calc Af Amer: >60 mL/min (ref >60)
GFR calc non Af Amer: >60 mL/min (ref >60)
Glucose, Bld: 129 mg/dL — ABNORMAL HIGH (ref 70–99)
Potassium: 4.1 mmol/L (ref 3.5–5.1)
Sodium: 130 mmol/L — ABNORMAL LOW (ref 135–145)

## 2019-03-15 LAB — CBC
HCT: 30.6 % — ABNORMAL LOW (ref 39.0–52.0)
Hemoglobin: 10.2 g/dL — ABNORMAL LOW (ref 13.0–17.0)
MCH: 29.4 pg (ref 26.0–34.0)
MCHC: 33.3 g/dL (ref 30.0–36.0)
MCV: 88.2 fL (ref 80.0–100.0)
Platelets: 178 10*3/uL (ref 150–400)
RBC: 3.47 MIL/uL — ABNORMAL LOW (ref 4.22–5.81)
RDW: 13.2 % (ref 11.5–15.5)
WBC: 6.7 10*3/uL (ref 4.0–10.5)
nRBC: 0 % (ref 0.0–0.2)

## 2019-03-15 MED ORDER — ONDANSETRON HCL 4 MG PO TABS: 4.0000 mg | ORAL_TABLET | Freq: Four times a day (QID) | ORAL | 0 refills | Status: AC | PRN

## 2019-03-15 MED ORDER — MORPHINE SULFATE (CONCENTRATE) 10 MG/0.5ML PO SOLN: 5.0000 mg | ORAL | 0 refills | Status: AC | PRN

## 2019-03-15 MED ORDER — QUETIAPINE FUMARATE 25 MG PO TABS: 25.0000 mg | ORAL_TABLET | Freq: Two times a day (BID) | ORAL | 0 refills | Status: AC

## 2019-03-15 MED ORDER — LORAZEPAM 0.5 MG PO TABS: 0.5000 mg | ORAL_TABLET | Freq: Four times a day (QID) | ORAL | 0 refills | Status: AC | PRN

## 2019-03-15 NOTE — Discharge Summary (Signed)
Physician Discharge Summary  Ross Cameron WFU:[id number] DOB: Sep 03, 1923 DOA: 03/11/2019  PCP: Cyndi Bender, PA-C  Admit date: 03/11/2019 Discharge date: 03/15/2019  Admitted From: home Disposition:  Home with home hospice  Recommendations for Outpatient Follow-up:  1. Hospice RN to answer pt or family's questions 2. 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 CT scan of the neck from 1/20/21noted the left lateral tracheal mass extending into the mediastinum and possibly into the esophagus. Admitted for biopsy of left tracheal mass due to SOB and dysphagia and vocal cord paralysis, consulted ENT, PCCM, radiation oncology.   He's now s/p bronchoscopy with biopsy on 03/12/19 with pathology report confirmed squamous cell carcinoma, which can be metastasized from prior parotid squamous cell carcinoma vs primary lung squamous cell carcinoma.   While in hospital with initial treatment of iv decadron to reduce the esophageal and trachial swelling, pt developed transient steroid induced psychosis/delirium which overall resolved after steroid dosage was reduced and eventually stopped.   Pt also has mild hyponatremia, likely SIADH due to cancer.   Radiation oncology and palliative care consulted, with patient's POA agreement for home hospice, plan for 10 palliative radiation treatments under hospice care. CT simulation was done on 03/14/19.   Prognosis is guarded. He's DNR. Hospice DMEs (hospital bed, over bed table, shower chair, BSC oxygen and wheelchair) were delivered to pt's house  before discharge. Pt will be under [facility].   Discharge Diagnoses:  Principal Problem:   Squamous cell carcinoma lung (HCC) Active Problems:   Tracheal mass   Mediastinal mass   Hyponatremia   Normocytic anemia   Essential hypertension   Anxiety   Dysphagia   Steroid-induced psychosis with complication (HCC)   Vocal cord dysfunction   History of parotid cancer   Advanced care planning/counseling discussion   Goals of care, counseling/discussion   Palliative care by specialist   Shortness of breath    Discharge Instructions  Discharge Instructions    Diet general   Complete by: As directed      Allergies as of 03/15/2019      Reactions   Aspirin    Causes bleeding      Medication List    STOP taking these medications   lisinopril 20 MG tablet Commonly known as: ZESTRIL     TAKE these medications   LORazepam 0.5 MG tablet Commonly known as: ATIVAN Place 1 tablet (0.5 mg total) under the tongue every 6 (six) hours as needed for anxiety (Please give prior to CT simulation).   morphine CONCENTRATE 10 MG/0.5ML Soln concentrated solution Place 0.25 mLs (5 mg total) under the tongue every 4 (four) hours as needed for severe pain, anxiety or shortness of breath.   omeprazole 20 MG capsule Commonly known as: PRILOSEC Take 20 mg by mouth daily as needed (heartburn/indigestion).   ondansetron 4 MG tablet Commonly known as: ZOFRAN Take 1 tablet (4 mg total) by mouth every 6 (six) hours as needed for nausea.   QUEtiapine 25 MG tablet Commonly known as: SEROQUEL Take 1 tablet (25 mg total) by mouth 2 (two) times daily.       Allergies  Allergen Reactions  . Aspirin     Causes  bleeding    Consultations:  Radiation oncology Shona Simpson PA-C  Critical Care: Dr. Ina Homes  ENT: Dr. Melida Quitter   Procedures/Studies: CT SOFT TISSUE NECK W CONTRAST  Addendum Date: 02/26/2019   ADDENDUM REPORT: 02/26/2019 16:28 ADDENDUM: These results were  called by telephone at the time of interpretation on 02/26/2019 at 4:27 pm to provider DWIGHT BATES , who verbally acknowledged these results. Also noted is a 16 mm pre carina lymph node. Electronically Signed   By: Franchot Gallo M.D.   On: 02/26/2019 16:28   Result Date: 02/26/2019 CLINICAL DATA:  Paralysis left vocal cord. EXAM: CT NECK WITH CONTRAST TECHNIQUE: Multidetector CT imaging of the neck was performed using the standard protocol following the bolus administration of intravenous contrast. CONTRAST:  71mL ISOVUE-300 IOPAMIDOL (ISOVUE-300) INJECTION 61% Creatinine was obtained on site at [facility] [place] at [address] Results: Creatinine 0.7 mg/dL. COMPARISON:  CT neck 11/05/2012 FINDINGS: Pharynx and larynx: Paralysis left vocal cord with medial deviation and enlargement of the left piriform sinus. No laryngeal mass. Airway intact. Normal pharynx. Salivary glands: Right parotidectomy. No recurrent mass lesion. Left parotid normal. Submandibular gland normal bilaterally. Thyroid: Bilateral subcentimeter thyroid nodules Lymph nodes: Probable enlarged lymph node in the left lower neck lateral to the thyroid and lateral to the left jugular vein. This appears to be separate from the jugular vein and indenting the jugular vein. Anatomic detail obscured by patient motion. This measures approximately 14 mm in diameter and is not definitely seen on the prior study. This is just above the clavicle. Vascular: Carotid artery calcification bilaterally. Carotid and jugular patent bilaterally. Limited intracranial: Negative Visualized orbits: Not imaged Mastoids and visualized paranasal sinuses: Clear Skeleton: Cervical spondylosis. No acute skeletal lesion. Upper chest: Irregular mass lesion in the left lateral trachea at the level the aortic arch. This measures approximately 2 x 4 cm. Extension into the superior mediastinum with possible extension into the proximal esophagus. Other: None IMPRESSION:  Paralysis left vocal cord. Bulky mass left lateral trachea extending into the mediastinum and possibly into the esophagus. This is consistent with carcinoma. 14 mm lymph node in the left lower neck lateral to the thyroid and jugular vein which could be malignant adenopathy. Electronically Signed: By: Franchot Gallo M.D. On: 02/26/2019 16:16   DG Chest Port 1 View  Result Date: 03/11/2019 CLINICAL DATA:  Shortness of breath for a few months. Preoperative examination. EXAM: PORTABLE CHEST 1 VIEW COMPARISON:  PA and lateral chest 09/27/2018. FINDINGS: Lungs are clear. Heart size is normal. No pneumothorax or pleural fluid. Aortic atherosclerosis is noted. No acute or focal bony abnormality. IMPRESSION: No acute disease. Atherosclerosis. Electronically Signed   By: Inge Rise M.D.   On: 03/11/2019 11:59    Bronchoscopy with EBUS biopsy 03/12/19    Subjective: pt states he's ready to go home. He's hard of hearing. No acute distress, sitting up in bed, hard of hearing bilaterally, on 1L [place], no obvious SOB at rest, no hoarseness, he has a condom catheter.    Discharge Exam: Vitals:   03/15/19 0301 03/15/19 0800  BP: 110/65   Pulse: (!) 52 64  Resp: 18 13  Temp: 98.2 F (36.8 C)   SpO2: 95% 100%   Vitals:   03/14/19 2235 03/15/19 0001 03/15/19 0301 03/15/19 0800  BP: (!) 107/59 101/60 110/65   Pulse: 65 70 (!) 52 64  Resp: 15 16 18 13   Temp: 98 F (36.7 C) 98.7 F (37.1 C) 98.2 F (36.8  C)   TempSrc:  Oral Oral   SpO2: 99% 99% 95% 100%  Weight:      Height:         General:  Alert, oriented, calm, in no acute distress, sitting upright on 1L O2; hard of hearing; elderly man appearance compatible with his age  Eyes: EOMI, clear sclerea  Neck: supple, nontender  Cardiovascular: RRR, no murmurs or rubs, no peripheral edema   Respiratory: clear to auscultation bilaterally, no wheezes, no crackles, upper airway wetness  Abdomen: soft, nontender, nondistended, normal bowel tones  heard   Skin: dry, no rashes   Musculoskeletal: no joint effusions, normal range of motion   Psychiatric: appropriate affect, normal speech   Neurologic: extraocular muscles intact, clear speech, moving all extremities with intact sensorium     The results of significant diagnostics from this hospitalization (including imaging, microbiology, ancillary and laboratory) are listed below for reference.     Microbiology: Recent Results (from the past 240 hour(s))  Respiratory Panel by RT PCR (Flu A&B, Covid) - Nasopharyngeal Swab     Status: None   Collection Time: 03/11/19 12:10 PM   Specimen: Nasopharyngeal Swab  Result Value Ref Range Status   SARS Coronavirus 2 by RT PCR NEGATIVE NEGATIVE Final    Comment: (NOTE) SARS-CoV-2 target nucleic acids are NOT DETECTED. The SARS-CoV-2 RNA is generally detectable in upper respiratoy specimens during the acute phase of infection. The lowest concentration of SARS-CoV-2 viral copies this assay can detect is 131 copies/mL. A negative result does not preclude SARS-Cov-2 infection and should not be used as the sole basis for treatment or other patient management decisions. A negative result may occur with  improper specimen collection/handling, submission of specimen other than nasopharyngeal swab, presence of viral mutation(s) within the areas targeted by this assay, and inadequate number of viral copies (<131 copies/mL). A negative result must be combined with clinical observations, patient history, and epidemiological information. The expected result is Negative. Fact Sheet for Patients:  PinkCheek.be Fact Sheet for Healthcare Providers:  GravelBags.it This test is not yet ap proved or cleared by the [place] FDA and  has been authorized for detection and/or diagnosis of SARS-CoV-2 by FDA under an Emergency Use Authorization (EUA). This EUA will remain  in effect (meaning  this test can be used) for the duration of the COVID-19 declaration under Section 564(b)(1) of the Act, 21 U.S.C. section 360bbb-3(b)(1), unless the authorization is terminated or revoked sooner.    Influenza A by PCR NEGATIVE NEGATIVE Final   Influenza B by PCR NEGATIVE NEGATIVE Final    Comment: (NOTE) The Xpert Xpress SARS-CoV-2/FLU/RSV assay is intended as an aid in  the diagnosis of influenza from Nasopharyngeal swab specimens and  should not be used as a sole basis for treatment. Nasal washings and  aspirates are unacceptable for Xpert Xpress SARS-CoV-2/FLU/RSV  testing. Fact Sheet for Patients: PinkCheek.be Fact Sheet for Healthcare Providers: GravelBags.it This test is not yet approved or cleared by the [place] FDA and  has been authorized for detection and/or diagnosis of SARS-CoV-2 by  FDA under an Emergency Use Authorization (EUA). This EUA will remain  in effect (meaning this test can be used) for the duration of the  Covid-19 declaration under Section 564(b)(1) of the Act, 21  U.S.C. section 360bbb-3(b)(1), unless the authorization is  terminated or revoked. Performed at [facility], [place] [address], [place], [place] [address]      Labs: BNP (last 3 results) No results  for input(s): BNP in the last 8760 hours. Basic Metabolic Panel: Recent Labs  Lab 03/11/19 1044 03/12/19 0210 03/13/19 0329 03/14/19 0206 03/15/19 0225  NA 130* 132* 134* 136 130*  K 4.9 4.1 4.9 4.1 4.1  CL 95* 98 105 102 100  CO2 25 23 21* 25 22  GLUCOSE 108* 231* 135* 126* 129*  BUN 7* 11 12 14 11   CREATININE 0.73 0.60* 0.66 0.58* 0.50*  CALCIUM 9.5 8.8* 9.0 9.0 8.3*   Liver Function Tests: Recent Labs  Lab 03/11/19 1044  AST 21  ALT 18  ALKPHOS 80  BILITOT 0.8  PROT 6.4*  ALBUMIN 3.4*   No results for input(s): LIPASE, AMYLASE in the last 168 hours. No results for input(s): AMMONIA in the last 168  hours. CBC: Recent Labs  Lab 03/11/19 1044 03/12/19 0210 03/13/19 0329 03/14/19 0206 03/15/19 0225  WBC 8.0 4.3 11.1* 10.1 6.7  HGB 11.7* 10.5* 10.9* 10.6* 10.2*  HCT 35.9* 31.2* 32.8* 31.8* 30.6*  MCV 90.7 87.6 89.9 88.1 88.2  PLT 257 205 203 210 178   Cardiac Enzymes: No results for input(s): CKTOTAL, CKMB, CKMBINDEX, TROPONINI in the last 168 hours. BNP: Invalid input(s): POCBNP CBG: No results for input(s): GLUCAP in the last 168 hours. D-Dimer No results for input(s): DDIMER in the last 72 hours. Hgb A1c No results for input(s): HGBA1C in the last 72 hours. Lipid Profile No results for input(s): CHOL, HDL, LDLCALC, TRIG, CHOLHDL, LDLDIRECT in the last 72 hours. Thyroid function studies No results for input(s): TSH, T4TOTAL, T3FREE, THYROIDAB in the last 72 hours.  Invalid input(s): FREET3 Anemia work up No results for input(s): VITAMINB12, FOLATE, FERRITIN, TIBC, IRON, RETICCTPCT in the last 72 hours. Urinalysis No results found for: COLORURINE, APPEARANCEUR, [place], [place], [place], [place], [place], [place], PROTEINUR, UROBILINOGEN, NITRITE, LEUKOCYTESUR Sepsis Labs Invalid input(s): PROCALCITONIN,  WBC,  LACTICIDVEN Microbiology Recent Results (from the past 240 hour(s))  Respiratory Panel by RT PCR (Flu A&B, Covid) - Nasopharyngeal Swab     Status: None   Collection Time: 03/11/19 12:10 PM   Specimen: Nasopharyngeal Swab  Result Value Ref Range Status   SARS Coronavirus 2 by RT PCR NEGATIVE NEGATIVE Final    Comment: (NOTE) SARS-CoV-2 target nucleic acids are NOT DETECTED. The SARS-CoV-2 RNA is generally detectable in upper respiratoy specimens during the acute phase of infection. The lowest concentration of SARS-CoV-2 viral copies this assay can detect is 131 copies/mL. A negative result does not preclude SARS-Cov-2 infection and should not be used as the sole basis for treatment or other patient management decisions. A negative result may occur  with  improper specimen collection/handling, submission of specimen other than nasopharyngeal swab, presence of viral mutation(s) within the areas targeted by this assay, and inadequate number of viral copies (<131 copies/mL). A negative result must be combined with clinical observations, patient history, and epidemiological information. The expected result is Negative. Fact Sheet for Patients:  PinkCheek.be Fact Sheet for Healthcare Providers:  GravelBags.it This test is not yet ap proved or cleared by the [place] FDA and  has been authorized for detection and/or diagnosis of SARS-CoV-2 by FDA under an Emergency Use Authorization (EUA). This EUA will remain  in effect (meaning this test can be used) for the duration of the COVID-19 declaration under Section 564(b)(1) of the Act, 21 U.S.C. section 360bbb-3(b)(1), unless the authorization is terminated or revoked sooner.    Influenza A by PCR NEGATIVE NEGATIVE Final   Influenza B by PCR NEGATIVE NEGATIVE Final  Comment: (NOTE) The Xpert Xpress SARS-CoV-2/FLU/RSV assay is intended as an aid in  the diagnosis of influenza from Nasopharyngeal swab specimens and  should not be used as a sole basis for treatment. Nasal washings and  aspirates are unacceptable for Xpert Xpress SARS-CoV-2/FLU/RSV  testing. Fact Sheet for Patients: PinkCheek.be Fact Sheet for Healthcare Providers: GravelBags.it This test is not yet approved or cleared by the [place] FDA and  has been authorized for detection and/or diagnosis of SARS-CoV-2 by  FDA under an Emergency Use Authorization (EUA). This EUA will remain  in effect (meaning this test can be used) for the duration of the  Covid-19 declaration under Section 564(b)(1) of the Act, 21  U.S.C. section 360bbb-3(b)(1), unless the authorization is  terminated or revoked. Performed  at [facility], [place] [address] [place], [place] [address]      Time coordinating discharge:  38 min  SIGNED:   Paticia Stack, MD  [facility] 03/15/2019, 8:50 AM Pager   If 7PM-7AM, please contact night-coverage [email or web address] Password [email or web address]

## 2019-03-15 NOTE — Plan of Care (Signed)
Dr. Crisoforo Oxford preparing patient orders for discharge home with hospice.

## 2019-03-15 NOTE — Care Management (Addendum)
Spoke w [facility], who will confirm delivery of DME to home. Spoke w wife who deferred to daughter Peter Congo. LVM w daughter Peter Congo to return call re DC planning.   Peter Congo states that DME has not been delivered to the house, she will call me back when it has been. She states that her sister is on the way to the hospital and was planning on transporting home. I notified Hospice that he will need O2 for transportation delivered to is room for discharge.  Patient's daughter Peter Congo informed other sister who was going to provide transport home that patient will DC via [facility]. I have now called Hospice and informed them to not deliver oxygen for transport to the hospital. Equipment will be delivered to the home around 2pm and [facility] will call me to let me know when this is set up so I can call [facility].    2:00 notified by Peter Congo that equipment delivered to house. [facility] called, forms on chart, nurse notified.

## 2019-03-17 ENCOUNTER — Telehealth: Payer: Self-pay | Admitting: Radiation Oncology

## 2019-03-17 ENCOUNTER — Ambulatory Visit: Payer: Medicare Other | Admitting: Radiation Oncology

## 2019-03-17 ENCOUNTER — Ambulatory Visit: Admitting: Radiation Oncology

## 2019-03-17 DIAGNOSIS — C383 Malignant neoplasm of mediastinum, part unspecified: Secondary | ICD-10-CM | POA: Diagnosis not present

## 2019-03-17 DIAGNOSIS — C33 Malignant neoplasm of trachea: Secondary | ICD-10-CM | POA: Diagnosis not present

## 2019-03-17 NOTE — Telephone Encounter (Signed)
I called to try to find out what the patient's plans are. His daughter called early today to cancel his appointment. This is very different than the plans we set out on last Friday when he came to simulation. She does not have voicemail. I've called several times today. I will keep trying to connect with her.

## 2019-03-18 ENCOUNTER — Ambulatory Visit: Admission: RE | Admit: 2019-03-18 | Source: Ambulatory Visit

## 2019-03-18 ENCOUNTER — Ambulatory Visit

## 2019-03-18 ENCOUNTER — Telehealth: Payer: Self-pay | Admitting: Radiation Oncology

## 2019-03-18 NOTE — Telephone Encounter (Signed)
I received a voicemail from Karlyne Greenspan, the patient's daughter stating that they had decided as a family they did not wish to pursue radiation as his prognosis is estimated by the palliative team to be less than 6 weeks. She asked on her message that we cancel his treatment and that she did not wish for a call back to discuss further. The treatment staff and Dr. Lisbeth Renshaw were notified.

## 2019-03-19 ENCOUNTER — Ambulatory Visit

## 2019-03-19 DIAGNOSIS — Z7401 Bed confinement status: Secondary | ICD-10-CM | POA: Diagnosis not present

## 2019-03-19 DIAGNOSIS — R0902 Hypoxemia: Secondary | ICD-10-CM | POA: Diagnosis not present

## 2019-03-20 ENCOUNTER — Ambulatory Visit

## 2019-03-21 ENCOUNTER — Ambulatory Visit

## 2019-03-24 ENCOUNTER — Ambulatory Visit

## 2019-03-25 ENCOUNTER — Ambulatory Visit

## 2019-03-26 ENCOUNTER — Ambulatory Visit

## 2019-03-27 ENCOUNTER — Ambulatory Visit

## 2019-03-28 ENCOUNTER — Ambulatory Visit

## 2019-03-31 ENCOUNTER — Ambulatory Visit

## 2019-04-07 DEATH — deceased

## 2019-04-10 ENCOUNTER — Encounter: Payer: Self-pay | Admitting: Radiation Therapy

## 2019-04-16 DIAGNOSIS — C33 Malignant neoplasm of trachea: Secondary | ICD-10-CM | POA: Insufficient documentation

## 2019-04-16 NOTE — Progress Notes (Signed)
  Radiation Oncology         [phone] ________________________________  Name: Ross Cameron MRN: [id number]  Date: 03/14/2019  DOB: 1923/05/09  SIMULATION AND TREATMENT PLANNING NOTE  DIAGNOSIS:     ICD-10-CM   1. 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  Identity was confirmed.  All relevant records and images related to the planned course of therapy were reviewed.   Written consent to proceed with treatment was confirmed which was freely given after reviewing the details related to the planned course of therapy had been reviewed with the patient.  Then, the patient was set-up in a stable reproducible  supine position for radiation therapy.  CT images were obtained.  Surface markings were placed.    Medically necessary complex treatment device(s) for immobilization:  Customized accuform device.   The CT images were loaded into the planning software.  Then the target and avoidance structures were contoured.  Treatment planning then occurred.  The radiation prescription was entered and confirmed.  A total of 2 complex treatment devices were fabricated which relate to the designed radiation treatment fields. Additional reduced fields will be used as necessary to improve the dose homogeneity of the plan. Each of these customized fields/ complex treatment devices will be used on a daily basis during the radiation course. I have requested : 3D Simulation  I have requested a DVH of the following structures: target volume, spinal cord, lungs, heart.   The patient will undergo daily image guidance to ensure accurate localization of the target, and adequate minimize dose to the normal surrounding structures in close proximity to the target.   PLAN:  The patient will receive 30 Gy in 10 fractions.    ________________________________   Jodelle Gross, MD, PhD

## 2020-04-06 DEATH — deceased
